# Patient Record
Sex: Male | Born: 1935 | Race: White | Hispanic: No | Marital: Single | Smoking: Former smoker
Health system: Southern US, Community
[De-identification: ages and names within clinical notes are randomized; demographics above are authoritative.]

## PROBLEM LIST (undated history)

## (undated) DIAGNOSIS — R000 Tachycardia, unspecified: Secondary | ICD-10-CM

## (undated) DIAGNOSIS — R296 Repeated falls: Secondary | ICD-10-CM

## (undated) DIAGNOSIS — E119 Type 2 diabetes mellitus without complications: Secondary | ICD-10-CM

## (undated) DIAGNOSIS — F1011 Alcohol abuse, in remission: Secondary | ICD-10-CM

## (undated) DIAGNOSIS — F0391 Unspecified dementia with behavioral disturbance: Secondary | ICD-10-CM

## (undated) DIAGNOSIS — K219 Gastro-esophageal reflux disease without esophagitis: Secondary | ICD-10-CM

## (undated) DIAGNOSIS — S065X9A Traumatic subdural hemorrhage with loss of consciousness of unspecified duration, initial encounter: Secondary | ICD-10-CM

## (undated) DIAGNOSIS — Z960 Presence of urogenital implants: Secondary | ICD-10-CM

## (undated) DIAGNOSIS — F03918 Unspecified dementia, unspecified severity, with other behavioral disturbance: Secondary | ICD-10-CM

## (undated) DIAGNOSIS — R569 Unspecified convulsions: Secondary | ICD-10-CM

## (undated) DIAGNOSIS — S065XAA Traumatic subdural hemorrhage with loss of consciousness status unknown, initial encounter: Secondary | ICD-10-CM

## (undated) DIAGNOSIS — Z978 Presence of other specified devices: Secondary | ICD-10-CM

## (undated) DIAGNOSIS — I10 Essential (primary) hypertension: Secondary | ICD-10-CM

---

## 2012-06-13 ENCOUNTER — Emergency Department (HOSPITAL_COMMUNITY)
Admission: EM | Admit: 2012-06-13 | Discharge: 2012-06-14 | Disposition: A | Discharge status: Home / Self Care | Payer: PRIVATE HEALTH INSURANCE | Attending: Emergency Medicine | Admitting: Emergency Medicine

## 2012-06-13 DIAGNOSIS — R339 Retention of urine, unspecified: Secondary | ICD-10-CM

## 2012-06-13 DIAGNOSIS — R338 Other retention of urine: Secondary | ICD-10-CM | POA: Insufficient documentation

## 2012-06-13 DIAGNOSIS — F0390 Unspecified dementia without behavioral disturbance: Secondary | ICD-10-CM | POA: Insufficient documentation

## 2012-06-13 DIAGNOSIS — S3730XA Unspecified injury of urethra, initial encounter: Secondary | ICD-10-CM | POA: Insufficient documentation

## 2012-06-13 DIAGNOSIS — S3720XA Unspecified injury of bladder, initial encounter: Secondary | ICD-10-CM | POA: Insufficient documentation

## 2012-06-13 DIAGNOSIS — Y846 Urinary catheterization as the cause of abnormal reaction of the patient, or of later complication, without mention of misadventure at the time of the procedure: Secondary | ICD-10-CM | POA: Insufficient documentation

## 2012-06-13 NOTE — ED Notes (Signed)
Bladder scanner read >200 ml

## 2012-06-13 NOTE — ED Notes (Signed)
[id number]<AV> Expected date:<BR> Expected time: 7:02 PM<BR> Means of arrival:<BR> Comments:<BR> Pulled foley out

## 2012-06-13 NOTE — ED Notes (Signed)
EMS called to [facility].  Staff stated that patient had removed his foley this AM with Balloon intact.  Patient has Hx of dementia

## 2012-06-13 NOTE — ED Notes (Addendum)
Pts had 2 episodes of diarrhea since arrival. RN is aware

## 2012-06-13 NOTE — ED Notes (Signed)
PER EMS- pt picked up from [facility] with c/o foley catheter pulled out,by pt.  Reports that pt has had foley catheter x3 mos.

## 2012-06-13 NOTE — ED Notes (Signed)
GU cart at bedside

## 2012-06-13 NOTE — ED Provider Notes (Signed)
History     CSN: [id number]  Arrival date & time 06/13/12  7829   First MD Initiated Contact with Patient 06/13/12 2005      Chief Complaint  Patient presents with  . Groin Injury    Foley Cath Pulled Out   level V caveat applies secondary to dementia   HPI  Patient is a 77 year old male with history of dementia who presents from [facility] nursing home after pulling out a Foley catheter. Per nursing home report patient pulled out Foley catheter with the balloon inflated. Patient has had an indwelling catheter for the past 3 months. Patient was noted to have bleeding from the penis after the incident. He was also uncomfortable and seemed to be complaining of pains. Patient has not had any mental status changes. He has not had any recent fevers or other symptoms.    No past medical history on file.  No past surgical history on file.  No family history on file.  History  Substance Use Topics  . Smoking status: Not on file  . Smokeless tobacco: Not on file  . Alcohol Use: Not on file      Review of Systems  Constitutional: Negative for fever.  All other systems reviewed and are negative.    Allergies  Review of patient's allergies indicates not on file.  Home Medications  No current outpatient prescriptions on file.  BP 182/87  Pulse 78  Temp 97.9 F (36.6 C) (Oral)  Resp 18  SpO2 94%  Physical Exam  Nursing note and vitals reviewed. Constitutional: He appears well-developed and well-nourished.  HENT:  Head: Normocephalic.  Cardiovascular: Normal rate and regular rhythm.   Pulmonary/Chest: Effort normal and breath sounds normal.  Genitourinary:       No active bleeding from the penis but there is dry blood at the tip of the penis around the scrotum. No swelling of the penis.  Neurological: He is alert.       Patient is demented and appears to be at baseline mental status. He is calm and cooperative.     ED Course  Procedures    Results for orders  placed during the hospital encounter of 06/13/12  URINALYSIS, ROUTINE W REFLEX MICROSCOPIC      Component Value Range   Color, Urine YELLOW  YELLOW   APPearance CLOUDY (*) CLEAR   Specific Gravity, Urine 1.016  1.005 - 1.030   pH 7.5  5.0 - 8.0   Glucose, UA NEGATIVE  NEGATIVE mg/dL   Hgb urine dipstick LARGE (*) NEGATIVE   Bilirubin Urine NEGATIVE  NEGATIVE   Ketones, ur NEGATIVE  NEGATIVE mg/dL   Protein, ur 30 (*) NEGATIVE mg/dL   Urobilinogen, UA 1.0  0.0 - 1.0 mg/dL   Nitrite NEGATIVE  NEGATIVE   Leukocytes, UA SMALL (*) NEGATIVE  URINE MICROSCOPIC-ADD ON      Component Value Range   Squamous Epithelial / LPF FEW (*) RARE   WBC, UA 3-6  <3 WBC/hpf   RBC / HPF TOO NUMEROUS TO COUNT  <3 RBC/hpf   Bacteria, UA FEW (*) RARE   Urine-Other FIELD OBSCURED BY RBC'S    CBC WITH DIFFERENTIAL      Component Value Range   WBC 16.0 (*) 4.0 - 10.5 K/uL   RBC 3.86 (*) 4.22 - 5.81 MIL/uL   Hemoglobin 11.4 (*) 13.0 - 17.0 g/dL   HCT 56.2 (*) 13.0 - 86.5 %   MCV 85.0  78.0 - 100.0 fL  MCH 29.5  26.0 - 34.0 pg   MCHC 34.8  30.0 - 36.0 g/dL   RDW 40.9  81.1 - 91.4 %   Platelets 213  150 - 400 K/uL   Neutrophils Relative 84 (*) 43 - 77 %   Neutro Abs 13.4 (*) 1.7 - 7.7 K/uL   Lymphocytes Relative 9 (*) 12 - 46 %   Lymphs Abs 1.5  0.7 - 4.0 K/uL   Monocytes Relative 6  3 - 12 %   Monocytes Absolute 0.9  0.1 - 1.0 K/uL   Eosinophils Relative 2  0 - 5 %   Eosinophils Absolute 0.2  0.0 - 0.7 K/uL   Basophils Relative 0  0 - 1 %   Basophils Absolute 0.0  0.0 - 0.1 K/uL  BASIC METABOLIC PANEL      Component Value Range   Sodium 135  135 - 145 mEq/L   Potassium 3.9  3.5 - 5.1 mEq/L   Chloride 97  96 - 112 mEq/L   CO2 27  19 - 32 mEq/L   Glucose, Bld 194 (*) 70 - 99 mg/dL   BUN 25 (*) 6 - 23 mg/dL   Creatinine, Ser 7.82  0.50 - 1.35 mg/dL   Calcium 9.3  8.4 - 95.6 mg/dL   GFR calc non Af Amer 80 (*) >90 mL/min   GFR calc Af Amer >90  >90 mL/min       1. Urethral injury   2.  Urinary retention       MDM  8:25 PM patient seen and evaluated. Patient does not appear in any acute distress. Patient is demented.  Nursing staff unable to pass Foley catheter. Pt discussed with Attending Physician.  Will consult urology  Spoke with Dr. Isabel Caprice on call for urology. He will come see patient. Would like to you can't the cystoscope at bedside.   11:40 PM Dr. Isabel Caprice at the bedside.   Dr. Isabel Caprice was able to pass a Foley catheter with almost 1000 L of output.      Phill Mutter Ali Molina, [place] 06/14/12 [phone]

## 2012-06-14 LAB — CBC WITH DIFFERENTIAL/PLATELET
Eosinophils Absolute: 0.2 10*3/uL (ref 0.0–0.7)
Eosinophils Relative: 2 % (ref 0–5)
Hemoglobin: 11.4 g/dL — ABNORMAL LOW (ref 13.0–17.0)
Lymphs Abs: 1.5 10*3/uL (ref 0.7–4.0)
MCH: 29.5 pg (ref 26.0–34.0)
MCV: 85 fL (ref 78.0–100.0)
Monocytes Absolute: 0.9 10*3/uL (ref 0.1–1.0)
Monocytes Relative: 6 % (ref 3–12)
RBC: 3.86 MIL/uL — ABNORMAL LOW (ref 4.22–5.81)

## 2012-06-14 LAB — BASIC METABOLIC PANEL
BUN: 25 mg/dL — ABNORMAL HIGH (ref 6–23)
Calcium: 9.3 mg/dL (ref 8.4–10.5)
Creatinine, Ser: 0.94 mg/dL (ref 0.50–1.35)
GFR calc non Af Amer: 80 mL/min — ABNORMAL LOW (ref >90)
Glucose, Bld: 194 mg/dL — ABNORMAL HIGH (ref 70–99)
Potassium: 3.9 mEq/L (ref 3.5–5.1)

## 2012-06-14 LAB — URINALYSIS, ROUTINE W REFLEX MICROSCOPIC
Bilirubin Urine: NEGATIVE
Glucose, UA: NEGATIVE mg/dL
Ketones, ur: NEGATIVE mg/dL
Nitrite: NEGATIVE
Protein, ur: 30 mg/dL — AB
pH: 7.5 (ref 5.0–8.0)

## 2012-06-14 MED ORDER — CEPHALEXIN 500 MG PO CAPS: 500.0000 mg | ORAL_CAPSULE | Freq: Four times a day (QID) | ORAL | Status: DC

## 2012-06-14 NOTE — ED Provider Notes (Signed)
Medical screening examination/treatment/procedure(s) were performed by non-physician practitioner and as supervising physician I was immediately available for consultation/collaboration.   Joden Bonsall, MD 06/14/12 2306 

## 2012-06-14 NOTE — Consult Note (Signed)
Urology Consult  Referring physician: Ivonne Andrew, PA-C   [facility] Reason for referral: Foley catheter trauma with inability to reinsert Foley catheter  History of Present Illness: 77 year old male with unclear prior urologic history who was brought in by a nursing facility this evening after pulling out his Foley catheter with the balloon inflated. Patient apparently suffers from significant dementia. We have no access to additional urologic records. Apparently he has had an indwelling Foley catheter for months now. Not clear whether he has done this in the past. The patient was noted to have blood per urethra. Multiple attempts at catheter insertion in the emergency room were unsuccessful and therefore we are asked to consult in to help assist in placing a new catheter.  No past medical history on file. No past surgical history on file.  Medications: Prior to Admission:  (Not in a hospital admission)  Allergies: No Known Allergies  No family history on file.  Social History:  does not have a smoking history on file. He does not have any smokeless tobacco history on file. His alcohol and drug histories not on file. Not obtainable   Physical Exam:  Vital signs in last 24 hours: Temp:  [97.9 F (36.6 C)] 97.9 F (36.6 C) (12/07 1921) Pulse Rate:  [78] 78  (12/07 1921) Resp:  [18] 18  (12/07 1921) BP: (182)/(87) 182/87 mmHg (12/07 1921) SpO2:  [94 %] 94 % (12/07 1921)  Constitutional: Vital signs reviewed. WD WN in NAD Head: Normocephalic and atraumatic    Neck: Supple No  Gross JVD, mass, thyromegaly, or carotid bruit present.  Cardiovascular: RRR Pulmonary/Chest: Normal effort Abdominal: Soft. Non-tender, non-distended, bowel sounds are normal, no masses, organomegaly, or guarding present.  Genitourinary: Circumcised penis with blood and meatus. Extremities: No cyanosis or edema  Neurological: Grossly non-focal.  Skin: Warm,very dry and intact. No rash,  cyanosis   Procedure:   Bedside flexible cystoscopy with difficult Foley catheter placement  Patient was prepped and draped in usual manner. A flexible cystoscopy one could see a significant false passage posteriorly within the bulbar urethra. I was able to guide the cystoscope into a distended bladder. Because of some hematuria and cloudy urine visualization of the bladder to rule out other pathology was not really possible. Through the cystoscope a guidewire was placed. Over the guidewire we placed a 16 [place] tip Foley catheter without difficulty. Approximate thousand cc of dark concentrated urine was obtained. Laboratory Data:  No results found for this or any previous visit (from the past 72 hour([name])). No results found for this or any previous visit (from the past 240 hour([name])). Creatinine: No results found for this basename: CREATININE:7 in the last 168 hours Baseline Creatinine:   Impression/Assessment:  Urinary retention with urethral trauma status post traumatic removal of Foley catheter with balloon inflated. Again I am uncertain about this patient'[name] urologic history. Recently he is at risk for recurrent episodes due to dementia and confusion. The patient may ultimately benefit from suprapubic tube placement. Urethral injury should heal with continued drainage utilizing a Foley catheter as well as the patient does not dramatic remove it again. Generally we do not like to treat bacteriuria in patients with chronic indwelling catheters been given the trauma he probably should be treated with 5-7 days of appropriate antibiotic with urine culture obtained prior to discharge. He should have some urologic followup probably around the time that he would be due for X. Foley change in 3-4 weeks.  Plan:  As above  Ricardo [name] 06/14/2012, 12:07 AM

## 2012-06-16 LAB — URINE CULTURE: Colony Count: 85000

## 2012-06-17 NOTE — ED Notes (Signed)
+   Urine Patient treated with Keflex-sensitive to same-chart appended per protocol MD. 

## 2012-10-17 ENCOUNTER — Emergency Department (HOSPITAL_COMMUNITY): Payer: Medicare Other

## 2012-10-17 ENCOUNTER — Emergency Department (HOSPITAL_COMMUNITY)
Admission: EM | Admit: 2012-10-17 | Discharge: 2012-10-18 | Disposition: A | Discharge status: Home / Self Care | Payer: Medicare Other | Attending: Emergency Medicine | Admitting: Emergency Medicine

## 2012-10-17 ENCOUNTER — Encounter (HOSPITAL_COMMUNITY): Payer: Self-pay | Admitting: Emergency Medicine

## 2012-10-17 DIAGNOSIS — F03918 Unspecified dementia, unspecified severity, with other behavioral disturbance: Secondary | ICD-10-CM | POA: Insufficient documentation

## 2012-10-17 DIAGNOSIS — Z8679 Personal history of other diseases of the circulatory system: Secondary | ICD-10-CM | POA: Insufficient documentation

## 2012-10-17 DIAGNOSIS — Z4682 Encounter for fitting and adjustment of non-vascular catheter: Secondary | ICD-10-CM | POA: Insufficient documentation

## 2012-10-17 DIAGNOSIS — G40909 Epilepsy, unspecified, not intractable, without status epilepticus: Secondary | ICD-10-CM | POA: Insufficient documentation

## 2012-10-17 DIAGNOSIS — Z8719 Personal history of other diseases of the digestive system: Secondary | ICD-10-CM | POA: Insufficient documentation

## 2012-10-17 DIAGNOSIS — Z794 Long term (current) use of insulin: Secondary | ICD-10-CM | POA: Insufficient documentation

## 2012-10-17 DIAGNOSIS — F0391 Unspecified dementia with behavioral disturbance: Secondary | ICD-10-CM

## 2012-10-17 DIAGNOSIS — E119 Type 2 diabetes mellitus without complications: Secondary | ICD-10-CM | POA: Insufficient documentation

## 2012-10-17 DIAGNOSIS — I10 Essential (primary) hypertension: Secondary | ICD-10-CM | POA: Insufficient documentation

## 2012-10-17 DIAGNOSIS — Z9181 History of falling: Secondary | ICD-10-CM | POA: Insufficient documentation

## 2012-10-17 DIAGNOSIS — Z79899 Other long term (current) drug therapy: Secondary | ICD-10-CM | POA: Insufficient documentation

## 2012-10-17 HISTORY — DX: Gastro-esophageal reflux disease without esophagitis: K21.9

## 2012-10-17 HISTORY — DX: Unspecified dementia, unspecified severity, with other behavioral disturbance: F03.918

## 2012-10-17 HISTORY — DX: Type 2 diabetes mellitus without complications: E11.9

## 2012-10-17 HISTORY — DX: Alcohol abuse, in remission: F10.11

## 2012-10-17 HISTORY — DX: Presence of other specified devices: Z97.8

## 2012-10-17 HISTORY — DX: Traumatic subdural hemorrhage with loss of consciousness of unspecified duration, initial encounter: S06.5X9A

## 2012-10-17 HISTORY — DX: Unspecified dementia with behavioral disturbance: F03.91

## 2012-10-17 HISTORY — DX: Unspecified convulsions: R56.9

## 2012-10-17 HISTORY — DX: Traumatic subdural hemorrhage with loss of consciousness status unknown, initial encounter: S06.5XAA

## 2012-10-17 HISTORY — DX: Essential (primary) hypertension: I10

## 2012-10-17 HISTORY — DX: Presence of urogenital implants: Z96.0

## 2012-10-17 HISTORY — DX: Tachycardia, unspecified: R00.0

## 2012-10-17 HISTORY — DX: Repeated falls: R29.6

## 2012-10-17 LAB — BASIC METABOLIC PANEL
BUN: 24 mg/dL — ABNORMAL HIGH (ref 6–23)
Creatinine, Ser: 0.93 mg/dL (ref 0.50–1.35)
GFR calc Af Amer: >90 mL/min (ref >90)
GFR calc non Af Amer: 80 mL/min — ABNORMAL LOW (ref >90)
Potassium: 3.7 mEq/L (ref 3.5–5.1)

## 2012-10-17 LAB — URINALYSIS, ROUTINE W REFLEX MICROSCOPIC
Bilirubin Urine: NEGATIVE
Ketones, ur: NEGATIVE mg/dL
Nitrite: POSITIVE — AB
Protein, ur: 30 mg/dL — AB
Specific Gravity, Urine: 1.025 (ref 1.005–1.030)
Urobilinogen, UA: 1 mg/dL (ref 0.0–1.0)

## 2012-10-17 LAB — CBC WITH DIFFERENTIAL/PLATELET
Basophils Relative: 0 % (ref 0–1)
Eosinophils Absolute: 0.3 10*3/uL (ref 0.0–0.7)
Hemoglobin: 10.5 g/dL — ABNORMAL LOW (ref 13.0–17.0)
MCH: 29.7 pg (ref 26.0–34.0)
MCHC: 34 g/dL (ref 30.0–36.0)
Monocytes Relative: 8 % (ref 3–12)
Neutrophils Relative %: 64 % (ref 43–77)

## 2012-10-17 LAB — URINE MICROSCOPIC-ADD ON
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
## 2012-10-18 ENCOUNTER — Emergency Department (HOSPITAL_COMMUNITY): Payer: Medicare Other

## 2012-10-18 LAB — GLUCOSE, CAPILLARY: Glucose-Capillary: 97 mg/dL (ref 70–99)

## 2012-10-18 MED ORDER — CARVEDILOL 12.5 MG PO TABS
12.5000 mg | ORAL_TABLET | Freq: Two times a day (BID) | ORAL | Status: DC
  Filled 2012-10-18 (×3): qty 1

## 2012-10-18 MED ORDER — CLONAZEPAM 0.5 MG PO TABS: 0.5000 mg | ORAL_TABLET | Freq: Two times a day (BID) | ORAL | Status: DC | PRN

## 2012-10-18 MED ORDER — NITROFURANTOIN MONOHYD MACRO 100 MG PO CAPS
100.0000 mg | ORAL_CAPSULE | Freq: Once | ORAL | Status: AC
  Administered 2012-10-18: 100 mg via ORAL
  Filled 2012-10-18: qty 1

## 2012-10-18 MED ORDER — FAMOTIDINE 20 MG PO TABS
20.0000 mg | ORAL_TABLET | Freq: Once | ORAL | Status: AC
  Administered 2012-10-18: 20 mg via ORAL
  Filled 2012-10-18: qty 1

## 2012-10-18 MED ORDER — VALPROIC ACID 250 MG PO CAPS
250.0000 mg | ORAL_CAPSULE | Freq: Once | ORAL | Status: AC
  Administered 2012-10-18: 250 mg via ORAL
  Filled 2012-10-18: qty 1

## 2012-10-18 NOTE — ED Notes (Signed)
[facility] notified for transportation back to [facility].

## 2012-10-20 LAB — URINE CULTURE

## 2012-10-21 ENCOUNTER — Telehealth (HOSPITAL_COMMUNITY): Payer: Self-pay | Admitting: Emergency Medicine

## 2012-10-23 ENCOUNTER — Telehealth (HOSPITAL_COMMUNITY): Payer: Self-pay | Admitting: Emergency Medicine

## 2012-10-23 NOTE — ED Notes (Signed)
No tx indicated per Arthor Captain PA

## 2012-11-09 ENCOUNTER — Non-Acute Institutional Stay (SKILLED_NURSING_FACILITY): Payer: Medicare Other | Admitting: Internal Medicine

## 2012-11-09 ENCOUNTER — Encounter: Payer: Self-pay | Admitting: Internal Medicine

## 2012-11-09 DIAGNOSIS — R5381 Other malaise: Secondary | ICD-10-CM

## 2012-11-09 DIAGNOSIS — E119 Type 2 diabetes mellitus without complications: Secondary | ICD-10-CM

## 2012-11-09 DIAGNOSIS — R1310 Dysphagia, unspecified: Secondary | ICD-10-CM

## 2012-11-09 DIAGNOSIS — R339 Retention of urine, unspecified: Secondary | ICD-10-CM

## 2012-11-09 DIAGNOSIS — F919 Conduct disorder, unspecified: Secondary | ICD-10-CM

## 2012-11-09 DIAGNOSIS — R4189 Other symptoms and signs involving cognitive functions and awareness: Secondary | ICD-10-CM

## 2012-11-09 DIAGNOSIS — R531 Weakness: Secondary | ICD-10-CM | POA: Insufficient documentation

## 2012-11-09 DIAGNOSIS — R4689 Other symptoms and signs involving appearance and behavior: Secondary | ICD-10-CM | POA: Insufficient documentation

## 2012-11-09 DIAGNOSIS — E039 Hypothyroidism, unspecified: Secondary | ICD-10-CM

## 2012-11-09 DIAGNOSIS — F0630 Mood disorder due to known physiological condition, unspecified: Secondary | ICD-10-CM | POA: Insufficient documentation

## 2012-11-09 DIAGNOSIS — K219 Gastro-esophageal reflux disease without esophagitis: Secondary | ICD-10-CM

## 2012-11-09 NOTE — Progress Notes (Signed)
Patient ID: Ricardo [name], male   DOB: 02-23-1935, 78 y.o.   MRN: [id number]    PCP: Oneal Grout, MD  Code Status: full code  No Known Allergies  Chief Complaint: medical management of chronic illness  HPI:  79 y/o male patient is here for LTC. He is s/p subdural hematoma from suspected seizure. He has been seizure free in the facility. He has behavioral problem and is followed by psych. He has peg tube in place and has not required any bolus feed recently. He is tolerating po feed well. He also has chronic indwelling foley catheter and recurrent uti. He failed voiding trial few month back. He has not seen urology. He is alert and oriented to person but restless and has minimal participation in conversation. He has baseline cognitive impairment. Reviewed psych notes from 09/21/12. No recent MMSE  Review of Systems: Review of Systems  Constitutional: Negative for fever, chills, weight loss and diaphoresis.  HENT: Negative for congestion and sore throat.   Eyes: Negative for blurred vision.  Respiratory: Negative for cough and shortness of breath.   Cardiovascular: Negative for chest pain, palpitations and leg swelling.  Gastrointestinal: Negative for heartburn, nausea, vomiting and abdominal pain.  Musculoskeletal: Negative for falls.       Fall risk  Skin: Negative for rash.  Neurological: Negative for dizziness and headaches.  Psychiatric/Behavioral: Positive for memory loss. Negative for depression. The patient is nervous/anxious.     Past Medical History  Diagnosis Date  . Subdural hematoma, post-traumatic   . Chronic indwelling foley catheter   . Dementia with aggressive behavior   . Frequent falls   . H/O alcohol abuse   . DM (diabetes mellitus)   . HTN (hypertension)   . Sinus tachycardia   . Seizures   . GERD (gastroesophageal reflux disease)    No past surgical history on file. Social History:   reports that he has quit smoking. He does not have any smokeless  tobacco history on file. His alcohol and drug histories are not on file.  No family history on file.  Medications: Patient's Medications  New Prescriptions   No medications on file  Previous Medications   ACETAMINOPHEN (TYLENOL) 325 MG TABLET    650 mg by PEG Tube route every 8 (eight) hours as needed. For fever/pain   ALBUTEROL (PROVENTIL) (2.5 MG/3ML) 0.083% NEBULIZER SOLUTION    Take 2.5 mg by nebulization every 4 (four) hours as needed. For shortness of breath   CARVEDILOL (COREG) 12.5 MG TABLET    12.5 mg by PEG Tube route 2 (two) times daily with a meal.   CLONAZEPAM (KLONOPIN) 0.5 MG TABLET    Take 1 tablet (0.5 mg total) by mouth 2 (two) times daily as needed for anxiety.   DIVALPROEX (DEPAKOTE SPRINKLE) 125 MG CAPSULE    Take 250 mg by mouth. Taking 250 mg bid and 125 mg at 2 pm   DOCUSATE SODIUM (COLACE) 100 MG CAPSULE    Take 100 mg by mouth 2 (two) times daily.    FAMOTIDINE (PEPCID) 20 MG TABLET    Take 20 mg by mouth 2 (two) times daily.    INSULIN DETEMIR (LEVEMIR) 100 UNIT/ML INJECTION    Inject 10 Units into the skin at bedtime.   INSULIN LISPRO (HUMALOG) 100 UNIT/ML INJECTION    Inject 3 Units into the skin 3 (three) times daily before meals. Per Sliding scale   LEVETIRACETAM (KEPPRA) 500 MG TABLET    500 mg by PEG Tube  route every 12 (twelve) hours.   LEVOTHYROXINE (SYNTHROID, LEVOTHROID) 50 MCG TABLET    Take 50 mcg by mouth daily before breakfast.   QUETIAPINE (SEROQUEL) 50 MG TABLET    Take 50 mg by mouth 2 (two) times daily.   SERTRALINE (ZOLOFT) 25 MG TABLET    Take 75 mg by mouth daily.    THIAMINE (VITAMIN B-1) 100 MG TABLET    Take 100 mg by mouth 2 (two) times daily.   TRAZODONE (DESYREL) 100 MG TABLET    Take 100 mg by mouth daily. At 4pm each day  Modified Medications   No medications on file  Discontinued Medications   DEXTROSE (GLUTOSE) 40 % GEL    Take by mouth once as needed. For cbg < 60    Physical Exam:  Filed Vitals:   11/09/12 1817  BP: 145/76   Pulse: 63  Temp: 97.4 F (36.3 C)  Resp: 18  SpO2: 95%   gen- frail elderly male in no acute distress heent- no pallor, no octerus, perrla, mmm cvs- n s1, s2, rrr, no m/r/g respi- b/l cta, no wheeze/ rhonchi Gi- peg tube in place, site clean, bowel sounds present, no guarding/ rigidity gu- foley in place, cloudy urine, strong odor, no suprapubic tenderness Musculoskeletal- moves all 4 extremiites, working with therapy team Neurological- aaox 2, calm, directable today  Labs reviewed: Basic Metabolic Panel:  Recent Labs  16/10/96 0129 10/17/12 2314  NA 135 140  K 3.9 3.7  CL 97 103  CO2 27 29  GLUCOSE 194* 97  BUN 25* 24*  CREATININE 0.94 0.93  CALCIUM 9.3 9.0   CBC:  Recent Labs  06/14/12 0129 10/17/12 2314  WBC 16.0* 9.7  NEUTROABS 13.4* 6.3  HGB 11.4* 10.5*  HCT 32.8* 30.9*  MCV 85.0 87.3  PLT 213 160   CBG:  Recent Labs  10/18/12 0644  GLUCAP 97   10/15/12- alp 123, rest of lft wnl, depakote level 19.7 10/02/12 urine culture- e.coli  Assessment/Plan  Dysphagia- improved, tolerating po feed well. Not using peg tube. Will provide gi referral for tube removal given his good po intake  Urinary retention-will provide another voiding trial and see how pt tolerates this. Will need to send u/a and urine c/s. Will also have him on flomax to help assist with urine outflow ans provide urology referral  Weakness- continue with PT/OT. Fall precuations  Cognitive impairment- rule out worsening by checking MMSE.   Mood disorder- calm at present with current regimen of depakote, clonazepam. Also continue seroquel and zoloft. Appreciate psych input  Seizure- remains seizure free. Continue keppra  HTN- bp under control. Continue coreg  gerd- symptoms under control with famotidine, no changes made  DM- monitor cbg, continue levemir and humalog, monitor for hypoglycemia. Check a1c  Hypothyroidism- continue levothyroxine, check tsh. Last tsh 12/13 was  10.799   Family/ staff Communication: reviewed plan of care with nursing supervisor   Goals of care: rehab, labs, have chronic problems under control   Labs/tests ordered: a1c, tsh, ua with c/s, voiding trial, urology and gi referral

## 2012-11-23 ENCOUNTER — Encounter: Payer: Self-pay | Admitting: Nurse Practitioner

## 2012-12-12 ENCOUNTER — Encounter (HOSPITAL_COMMUNITY): Payer: Self-pay | Admitting: Emergency Medicine

## 2012-12-12 ENCOUNTER — Emergency Department (HOSPITAL_COMMUNITY)
Admission: EM | Admit: 2012-12-12 | Discharge: 2012-12-13 | Disposition: A | Discharge status: Skilled Nursing Facility | Payer: Medicare Other | Attending: Emergency Medicine | Admitting: Emergency Medicine

## 2012-12-12 DIAGNOSIS — G40802 Other epilepsy, not intractable, without status epilepticus: Secondary | ICD-10-CM | POA: Insufficient documentation

## 2012-12-12 DIAGNOSIS — I10 Essential (primary) hypertension: Secondary | ICD-10-CM | POA: Insufficient documentation

## 2012-12-12 DIAGNOSIS — F0391 Unspecified dementia with behavioral disturbance: Secondary | ICD-10-CM | POA: Insufficient documentation

## 2012-12-12 DIAGNOSIS — K219 Gastro-esophageal reflux disease without esophagitis: Secondary | ICD-10-CM | POA: Insufficient documentation

## 2012-12-12 DIAGNOSIS — E119 Type 2 diabetes mellitus without complications: Secondary | ICD-10-CM | POA: Insufficient documentation

## 2012-12-12 DIAGNOSIS — Z87891 Personal history of nicotine dependence: Secondary | ICD-10-CM | POA: Insufficient documentation

## 2012-12-12 DIAGNOSIS — N390 Urinary tract infection, site not specified: Secondary | ICD-10-CM

## 2012-12-12 DIAGNOSIS — F03918 Unspecified dementia, unspecified severity, with other behavioral disturbance: Secondary | ICD-10-CM | POA: Insufficient documentation

## 2012-12-12 NOTE — ED Notes (Addendum)
Per EMS, Pt from [facility], Staff report pt was having lower abdominal pain. However when EMS arrived and palpated pt's abdomen pt did not respond. When EMS moved pt, pt grabbed his groin. Staff denies pt having problems urinating, frequency, odor, or blood in urine. Pt is limited with communication, pt has a hx of dementia. VSS.

## 2012-12-12 NOTE — ED Notes (Signed)
Pt would not answer RN's questions when RN was assessing pt. Pt has hx of dementia and has minimal verbal communication at baseline per facility.

## 2012-12-13 ENCOUNTER — Emergency Department (HOSPITAL_COMMUNITY): Payer: Medicare Other

## 2012-12-13 LAB — CBC WITH DIFFERENTIAL/PLATELET
Eosinophils Absolute: 0.3 10*3/uL (ref 0.0–0.7)
Eosinophils Relative: 4 % (ref 0–5)
HCT: 28.7 % — ABNORMAL LOW (ref 39.0–52.0)
Lymphocytes Relative: 27 % (ref 12–46)
Lymphs Abs: 2.2 10*3/uL (ref 0.7–4.0)
MCH: 30.8 pg (ref 26.0–34.0)
MCV: 87.5 fL (ref 78.0–100.0)
Monocytes Absolute: 0.8 10*3/uL (ref 0.1–1.0)
Platelets: 149 10*3/uL — ABNORMAL LOW (ref 150–400)
RBC: 3.28 MIL/uL — ABNORMAL LOW (ref 4.22–5.81)

## 2012-12-13 LAB — COMPREHENSIVE METABOLIC PANEL
ALT: 10 U/L (ref 0–53)
BUN: 30 mg/dL — ABNORMAL HIGH (ref 6–23)
CO2: 29 mEq/L (ref 19–32)
Calcium: 9 mg/dL (ref 8.4–10.5)
Creatinine, Ser: 0.98 mg/dL (ref 0.50–1.35)
GFR calc Af Amer: >90 mL/min (ref >90)
GFR calc non Af Amer: 78 mL/min — ABNORMAL LOW (ref >90)
Glucose, Bld: 88 mg/dL (ref 70–99)
Sodium: 138 mEq/L (ref 135–145)
Total Protein: 6.3 g/dL (ref 6.0–8.3)

## 2012-12-13 LAB — URINALYSIS, ROUTINE W REFLEX MICROSCOPIC
Ketones, ur: NEGATIVE mg/dL
Nitrite: NEGATIVE
Specific Gravity, Urine: 1.019 (ref 1.005–1.030)
Urobilinogen, UA: 2 mg/dL — ABNORMAL HIGH (ref 0.0–1.0)
pH: 7 (ref 5.0–8.0)

## 2012-12-13 LAB — URINE MICROSCOPIC-ADD ON

## 2012-12-13 MED ORDER — CEFTRIAXONE SODIUM 1 G IJ SOLR
1.0000 g | Freq: Once | INTRAMUSCULAR | Status: AC
  Administered 2012-12-13: 1 g via INTRAMUSCULAR
  Filled 2012-12-13: qty 10

## 2012-12-13 MED ORDER — CEPHALEXIN 500 MG PO CAPS: 500.0000 mg | ORAL_CAPSULE | Freq: Four times a day (QID) | ORAL | Status: DC

## 2012-12-13 MED ORDER — LIDOCAINE HCL (PF) 1 % IJ SOLN
INTRAMUSCULAR | Status: AC
  Administered 2012-12-13: 2.1 mL
  Filled 2012-12-13: qty 5

## 2012-12-13 MED ORDER — DEXTROSE 5 % IV SOLN: 1.0000 g | Freq: Once | INTRAVENOUS | Status: DC

## 2012-12-13 NOTE — ED Provider Notes (Signed)
History     CSN: [id number]  Arrival date & time 12/12/12  2325   First MD Initiated Contact with Patient 12/12/12 2359      Chief Complaint  Patient presents with  . Groin Pain    (Consider location/radiation/quality/duration/timing/severity/associated sxs/prior treatment) HPI Comments: Patient with history of dementia sent from [facility] for evaluation of groin pain.  He was apparently complaining of lower abdominal / groin pain to the nursing staff.  He is unable to add any useful history due to dementia.  A level 5 caveat applies.  The history is provided by the patient, the EMS personnel and the nursing home.    Past Medical History  Diagnosis Date  . Subdural hematoma, post-traumatic   . Chronic indwelling Foley catheter   . Dementia with aggressive behavior   . Frequent falls   . H/O alcohol abuse   . DM (diabetes mellitus)   . HTN (hypertension)   . Sinus tachycardia   . Seizures   . GERD (gastroesophageal reflux disease)     History reviewed. No pertinent past surgical history.  No family history on file.  History  Substance Use Topics  . Smoking status: Former Games developer  . Smokeless tobacco: Not on file  . Alcohol Use: Not on file      Review of Systems  Unable to perform ROS   Allergies  Review of patient's allergies indicates no known allergies.  Home Medications   Current Outpatient Rx  Name  Route  Sig  Dispense  Refill  . acetaminophen (TYLENOL) 325 MG tablet   PEG Tube   650 mg by PEG Tube route every 8 (eight) hours as needed. For fever/pain         . carvedilol (COREG) 12.5 MG tablet   PEG Tube   12.5 mg by PEG Tube route 2 (two) times daily with a meal.         . clonazePAM (KLONOPIN) 0.5 MG tablet   Oral   Take 1 tablet (0.5 mg total) by mouth 2 (two) times daily as needed for anxiety.   30 tablet   0   . divalproex (DEPAKOTE SPRINKLE) 125 MG capsule   Oral   Take 250 mg by mouth 2 (two) times daily.          . divalproex  (DEPAKOTE SPRINKLE) 125 MG capsule   Oral   Take 125 mg by mouth daily at 2 PM daily at 2 PM.         . docusate (COLACE) 50 MG/5ML liquid   Oral   Take 100 mg by mouth 2 (two) times daily.         . famotidine (PEPCID) 20 MG tablet   Oral   Take 20 mg by mouth 2 (two) times daily.          . insulin detemir (LEVEMIR) 100 UNIT/ML injection   Subcutaneous   Inject 10 Units into the skin at bedtime.         . insulin lispro (HUMALOG) 100 UNIT/ML injection   Subcutaneous   Inject 3 Units into the skin every 6 (six) hours as needed for high blood sugar. Per Sliding scale         . levETIRAcetam (KEPPRA) 500 MG tablet   Oral   Take 500 mg by mouth 2 (two) times daily.          Marland Kitchen levothyroxine (SYNTHROID, LEVOTHROID) 75 MCG tablet   Oral   Take 75 mcg by mouth  daily before breakfast.         . QUEtiapine (SEROQUEL) 50 MG tablet   Oral   Take 50 mg by mouth 2 (two) times daily.         . sertraline (ZOLOFT) 25 MG tablet   Oral   Take 75 mg by mouth daily.          . tamsulosin (FLOMAX) 0.4 MG CAPS   Oral   Take 0.4 mg by mouth daily.         Marland Kitchen thiamine (VITAMIN B-1) 100 MG tablet   Oral   Take 100 mg by mouth 2 (two) times daily.         . traZODone (DESYREL) 100 MG tablet   Oral   Take 100 mg by mouth daily. At 4pm each day         . albuterol (PROVENTIL) (2.5 MG/3ML) 0.083% nebulizer solution   Nebulization   Take 2.5 mg by nebulization every 4 (four) hours as needed. For shortness of breath           BP 110/47  Pulse 68  Temp(Src) 98.5 F (36.9 C) (Oral)  Resp 16  SpO2 100%  Physical Exam  Nursing note and vitals reviewed. Constitutional:  Elderly male, no distress.  Appears comfortable.  HENT:  Head: Normocephalic and atraumatic.  Mouth/Throat: Oropharynx is clear and moist.  Neck: Normal range of motion. Neck supple.  Cardiovascular: Normal rate and regular rhythm.   No murmur heard. Pulmonary/Chest: Effort normal and  breath sounds normal. No respiratory distress. He has no wheezes.  Abdominal: Soft. Bowel sounds are normal. He exhibits no distension. There is no tenderness.  Musculoskeletal: Normal range of motion.  Both hips with good range of motion with no apparent discomfort.  Neurological: He is alert.  He is basically non-verbal to me, not answering any questions.  He does move all four extremities  Skin: Skin is warm and dry.    ED Course  Procedures (including critical care time)  Labs Reviewed - No data to display No results found.   No diagnosis found.    MDM  The workup shows no elevation of wbc, obstruction, but does show a uti.  This seems to fit the clinical presentation.  Will treat with rocephin, keflex.  Return prn.        Geoffery Lyons, MD 12/13/12 [phone]

## 2012-12-13 NOTE — ED Notes (Signed)
Patient transported to X-ray 

## 2012-12-13 NOTE — ED Notes (Signed)
Pt returned from XR. Placed back on monitor.

## 2012-12-13 NOTE — ED Notes (Signed)
Pt moaning in pain. Pt able to tell RN where pain is now. Pt points to pubic area when asked where pain is.

## 2012-12-13 NOTE — ED Notes (Signed)
MD at bedside. 

## 2012-12-13 NOTE — ED Notes (Signed)
PTAR paged. 

## 2012-12-15 LAB — URINE CULTURE

## 2012-12-16 NOTE — ED Notes (Signed)
Post ED Visit - Positive Culture Follow-up  Culture report reviewed by antimicrobial stewardship pharmacist: [x]  Ricardo [name], Pharm.D., BCPS []  Celedonio Miyamoto, [address].D., BCPS []  Georgina Pillion, Pharm.D., BCPS []  [place], [address].D., BCPS, AAHIVP []  Estella Husk, Pharm.D., BCPS, AAHIVP  Positive urine culture  no further patient follow-up is required at this time.  Ricardo [name] 12/16/2012, 4:16 PM

## 2012-12-23 ENCOUNTER — Encounter: Payer: Self-pay | Admitting: Internal Medicine

## 2013-01-14 ENCOUNTER — Encounter (INDEPENDENT_AMBULATORY_CARE_PROVIDER_SITE_OTHER): Payer: Medicare Other | Admitting: Ophthalmology

## 2013-01-14 DIAGNOSIS — H43819 Vitreous degeneration, unspecified eye: Secondary | ICD-10-CM

## 2013-01-14 DIAGNOSIS — H3530 Unspecified macular degeneration: Secondary | ICD-10-CM

## 2013-02-03 ENCOUNTER — Non-Acute Institutional Stay (SKILLED_NURSING_FACILITY): Payer: Medicare Other | Admitting: Adult Health

## 2013-02-03 DIAGNOSIS — E119 Type 2 diabetes mellitus without complications: Secondary | ICD-10-CM

## 2013-02-03 DIAGNOSIS — K5900 Constipation, unspecified: Secondary | ICD-10-CM

## 2013-02-03 DIAGNOSIS — R339 Retention of urine, unspecified: Secondary | ICD-10-CM

## 2013-02-03 DIAGNOSIS — N400 Enlarged prostate without lower urinary tract symptoms: Secondary | ICD-10-CM

## 2013-02-03 DIAGNOSIS — F323 Major depressive disorder, single episode, severe with psychotic features: Secondary | ICD-10-CM

## 2013-02-03 DIAGNOSIS — I10 Essential (primary) hypertension: Secondary | ICD-10-CM

## 2013-02-03 DIAGNOSIS — R1310 Dysphagia, unspecified: Secondary | ICD-10-CM

## 2013-02-03 DIAGNOSIS — K219 Gastro-esophageal reflux disease without esophagitis: Secondary | ICD-10-CM

## 2013-02-03 DIAGNOSIS — E039 Hypothyroidism, unspecified: Secondary | ICD-10-CM

## 2013-02-03 DIAGNOSIS — R569 Unspecified convulsions: Secondary | ICD-10-CM

## 2013-02-03 DIAGNOSIS — G4700 Insomnia, unspecified: Secondary | ICD-10-CM

## 2013-02-03 DIAGNOSIS — F32A Depression, unspecified: Secondary | ICD-10-CM

## 2013-02-08 ENCOUNTER — Encounter: Payer: Self-pay | Admitting: Adult Health

## 2013-02-08 DIAGNOSIS — K5900 Constipation, unspecified: Secondary | ICD-10-CM | POA: Insufficient documentation

## 2013-02-08 DIAGNOSIS — N400 Enlarged prostate without lower urinary tract symptoms: Secondary | ICD-10-CM | POA: Insufficient documentation

## 2013-02-08 DIAGNOSIS — I10 Essential (primary) hypertension: Secondary | ICD-10-CM | POA: Insufficient documentation

## 2013-02-08 DIAGNOSIS — R569 Unspecified convulsions: Secondary | ICD-10-CM | POA: Insufficient documentation

## 2013-02-08 DIAGNOSIS — F323 Major depressive disorder, single episode, severe with psychotic features: Secondary | ICD-10-CM | POA: Insufficient documentation

## 2013-02-08 DIAGNOSIS — G4700 Insomnia, unspecified: Secondary | ICD-10-CM | POA: Insufficient documentation

## 2013-02-08 NOTE — Assessment & Plan Note (Signed)
Will continue coreg 12.5 mg twice daily and will continue to monitor

## 2013-02-08 NOTE — Assessment & Plan Note (Signed)
Will continue flomax 0.4 mg daily  

## 2013-02-08 NOTE — Assessment & Plan Note (Signed)
Is voiding without difficulty; will continue flomax 0.4 mg daily

## 2013-02-08 NOTE — Assessment & Plan Note (Addendum)
No signs of aspiration present. Is not requiring the use of the peg tube for nutritional support. Does require nectar thick liquids; will continue current plan and will monitor his status

## 2013-02-08 NOTE — Progress Notes (Signed)
Patient ID: Ricardo [name], male   DOB: 06-Mar-1935, 78 y.o.   MRN: [id number]  [facility]  .No Known Allergies   Chief Complaint  Patient presents with  . Medical Managment of Chronic Issues    HPI: He is being seen for the management of his chronic illnesses. There are no concerns being voiced by the nursing staff at this time. He is not using his peg and has not used it apparently for a prolonged period of time; we need to think about removing the tube. He had an urine culture demonstrate yeast in the beginning of this month but I cannot find where this has been treated. Will need to recollect the specimen  Past Medical History  Diagnosis Date  . Subdural hematoma, post-traumatic   . Chronic indwelling Foley catheter   . Dementia with aggressive behavior   . Frequent falls   . H/O alcohol abuse   . DM (diabetes mellitus)   . HTN (hypertension)   . Sinus tachycardia   . Seizures   . GERD (gastroesophageal reflux disease)     No past surgical history on file.  VITAL SIGNS BP 117/63  Pulse 60  Ht 5\' 7"  (1.702 m)  Wt 131 lb 3.2 oz (59.512 kg)  BMI 20.54 kg/m2   Patient's Medications  New Prescriptions   No medications on file  Previous Medications   ACETAMINOPHEN (TYLENOL) 325 MG TABLET    650 mg by PEG Tube route every 8 (eight) hours as needed. For fever/pain   ALBUTEROL (PROVENTIL) (2.5 MG/3ML) 0.083% NEBULIZER SOLUTION    Take 2.5 mg by nebulization every 4 (four) hours as needed. For shortness of breath   CARVEDILOL (COREG) 12.5 MG TABLET    12.5 mg by PEG Tube route 2 (two) times daily with a meal.   CEPHALEXIN (KEFLEX) 500 MG CAPSULE    Take 1 capsule (500 mg total) by mouth 4 (four) times daily.   CEPHALEXIN (KEFLEX) 500 MG CAPSULE    Take 1 capsule (500 mg total) by mouth 4 (four) times daily.   CLONAZEPAM (KLONOPIN) 0.5 MG TABLET    Take 1 tablet (0.5 mg total) by mouth 2 (two) times daily as needed for anxiety.   DIVALPROEX (DEPAKOTE SPRINKLE) 125 MG CAPSULE     Take 250 mg by mouth 2 (two) times daily.    DIVALPROEX (DEPAKOTE SPRINKLE) 125 MG CAPSULE    Take 125 mg by mouth daily at 2 PM daily at 2 PM.   DOCUSATE (COLACE) 50 MG/5ML LIQUID    Take 100 mg by mouth 2 (two) times daily.   FAMOTIDINE (PEPCID) 20 MG TABLET    Take 20 mg by mouth 2 (two) times daily.    FOOD THICKENER (THICK IT) POWD    Take 1 Container by mouth as needed. For nectar thick liquids   INSULIN DETEMIR (LEVEMIR) 100 UNIT/ML INJECTION    Inject 10 Units into the skin at bedtime.   INSULIN LISPRO (HUMALOG) 100 UNIT/ML INJECTION    Inject 3 Units into the skin every 6 (six) hours as needed for high blood sugar. Per Sliding scale   LEVETIRACETAM (KEPPRA) 500 MG TABLET    Take 500 mg by mouth 2 (two) times daily.    LEVOTHYROXINE (SYNTHROID, LEVOTHROID) 75 MCG TABLET    Take 75 mcg by mouth daily before breakfast.   QUETIAPINE (SEROQUEL) 50 MG TABLET    Take 50 mg by mouth 2 (two) times daily.   SERTRALINE (ZOLOFT) 25 MG TABLET  Take 75 mg by mouth daily.    TAMSULOSIN (FLOMAX) 0.4 MG CAPS    Take 0.4 mg by mouth daily.   THIAMINE (VITAMIN B-1) 100 MG TABLET    Take 100 mg by mouth 2 (two) times daily.   TRAZODONE (DESYREL) 100 MG TABLET    Take 100 mg by mouth daily. At 4pm each day  Modified Medications   No medications on file  Discontinued Medications   No medications on file    SIGNIFICANT DIAGNOSTIC EXAMS    LABS REVIEWED  11-17-12: wbc 8.4; hgb 11.1; hct 33.0; mcv 88.5 ;plt 147; glucose 98; bun 20; creat 1.02; k+3.6 Na++ 142; liver normal albumin 3.7; hgb a1c 6.0 12-13-12: wbc 7.9; hgb 10.1; hct 28.7; mcv 87.5; plt 149; glucose 88; bun 30; creat 0.98; k+3.7;  Na++ 138; liver normal albumin 3.0 01-06-13: tsh 4.815 01-07-13; urine culture: yeast    Review of Systems  Constitutional: Negative for malaise/fatigue.  Respiratory: Negative for cough and shortness of breath.   Cardiovascular: Negative for chest pain and palpitations.  Gastrointestinal: Negative for  heartburn, abdominal pain and constipation.  Musculoskeletal: Negative for myalgias and joint pain.  Skin: Negative.   Neurological: Negative for headaches.  Psychiatric/Behavioral: Negative for depression. The patient does not have insomnia.    Physical Exam  Constitutional: He appears well-developed and well-nourished.  Neck: Neck supple. No tracheal deviation present. No thyromegaly present.  Cardiovascular: Normal rate, regular rhythm and intact distal pulses.   Respiratory: Effort normal and breath sounds normal. No respiratory distress. He has no wheezes.  GI: Soft. Bowel sounds are normal. He exhibits no distension. There is no tenderness.  Peg tube present  Musculoskeletal: Normal range of motion. He exhibits no edema.  Neurological: He is alert.  Skin: Skin is warm and dry.  Psychiatric: He has a normal mood and affect.       ASSESSMENT/ PLAN:  Diabetes mellitus type 2 in nonobese Is stable will continue levemir 10 units daily and humalog twice daily 3 units for cbg >200 will monitor   Dysphagia, unspecified(787.20) No signs of aspiration present. Is not requiring the use of the peg tube for nutritional support. Does require nectar thick liquids; will continue current plan and will monitor his status   GERD (gastroesophageal reflux disease) Will continue pepcid 20 mg twice daily  Hypothyroidism Will continue synthroid 75 mcg daily   Depressive type psychosis His mood state is stable will continue zoloft 75 mg daily; seroquel 50 mg twice daily; klonopin 0.5 mg twice daily as needed for anxiety and depakote 250 mg twice daily and 125 mg in the afternoon will monitor  BPH (benign prostatic hyperplasia) Will continue flomax 0.4 mg daily   Essential hypertension, benign Will continue coreg 12.5 mg twice daily and will continue to monitor  Seizures No reports of seizure activity present will continue keppra 500 mg twice daily and will monitor  Unspecified  constipation Will continue colace twice daily   Urinary retention Is voiding without difficulty; will continue flomax 0.4 mg daily   Insomnia Will continue trazodone 100 mg nightly     Time spent with patient 50 minutes.

## 2013-02-08 NOTE — Assessment & Plan Note (Signed)
No reports of seizure activity present will continue keppra 500 mg twice daily and will monitor

## 2013-02-08 NOTE — Assessment & Plan Note (Signed)
Will continue trazodone 100 mg nightly

## 2013-02-08 NOTE — Assessment & Plan Note (Signed)
Will continue synthroid 75 mcg daily  

## 2013-02-08 NOTE — Assessment & Plan Note (Signed)
Will continue colace twice daily 

## 2013-02-08 NOTE — Assessment & Plan Note (Signed)
His mood state is stable will continue zoloft 75 mg daily; seroquel 50 mg twice daily; klonopin 0.5 mg twice daily as needed for anxiety and depakote 250 mg twice daily and 125 mg in the afternoon will monitor

## 2013-02-08 NOTE — Assessment & Plan Note (Signed)
Is stable will continue levemir 10 units daily and humalog twice daily 3 units for cbg >200 will monitor

## 2013-02-08 NOTE — Assessment & Plan Note (Signed)
Will continue pepcid 20 mg twice daily  

## 2013-02-12 ENCOUNTER — Non-Acute Institutional Stay (SKILLED_NURSING_FACILITY): Payer: Medicare Other | Admitting: Internal Medicine

## 2013-02-12 DIAGNOSIS — R1310 Dysphagia, unspecified: Secondary | ICD-10-CM

## 2013-02-12 DIAGNOSIS — F0151 Vascular dementia with behavioral disturbance: Secondary | ICD-10-CM

## 2013-02-12 DIAGNOSIS — F32A Depression, unspecified: Secondary | ICD-10-CM

## 2013-02-12 DIAGNOSIS — F323 Major depressive disorder, single episode, severe with psychotic features: Secondary | ICD-10-CM

## 2013-02-12 DIAGNOSIS — E119 Type 2 diabetes mellitus without complications: Secondary | ICD-10-CM

## 2013-02-12 DIAGNOSIS — F0153 Vascular dementia, unspecified severity, with mood disturbance: Secondary | ICD-10-CM

## 2013-02-12 DIAGNOSIS — F0150 Vascular dementia without behavioral disturbance: Secondary | ICD-10-CM

## 2013-02-12 DIAGNOSIS — F329 Major depressive disorder, single episode, unspecified: Secondary | ICD-10-CM

## 2013-02-12 NOTE — Progress Notes (Signed)
Patient ID: Ricardo [name], male   DOB: 03-05-1935, 78 y.o.   MRN: [id number]  [facility]  Chief complaint- cognitive decline  HPI 78 y/o male patient is a long term care resident of the facility. He has history of dementia. As per staff, he is less interactive these days, does not participate in activities. He is pushing himself around in a wheelchair by dragging his feet. He is under total care, eats with restorative dining. His oral intake is fair. No recent fall. No acute behavioral changes. Tolerating po feed well.   Review of Systems  Constitutional: Negative for malaise/fatigue.  Respiratory: Negative for cough and shortness of breath.   Cardiovascular: Negative for chest pain and palpitations.  Gastrointestinal: Negative for heartburn, abdominal pain and constipation.  Musculoskeletal: Negative for myalgias and joint pain.  Skin: Negative.   Neurological: Negative for headaches.  Psychiatric/Behavioral: being treated for depression. The patient does not have insomnia.   No Known Allergies  Past Medical History  Diagnosis Date  . Subdural hematoma, post-traumatic   . Chronic indwelling Foley catheter   . Dementia with aggressive behavior   . Frequent falls   . H/O alcohol abuse   . DM (diabetes mellitus)   . HTN (hypertension)   . Sinus tachycardia   . Seizures   . GERD (gastroesophageal reflux disease)     Physical Exam   VSS, afebrile  Constitutional: He appears well-developed and well-nourished.  Neck: Neck supple. No tracheal deviation present. No thyromegaly present.  Cardiovascular: Normal rate, regular rhythm and intact distal pulses.   Respiratory: Effort normal and breath sounds normal. No respiratory distress. He has no wheezes.  GI: Soft. Bowel sounds are normal. He exhibits no distension. There is no tenderness.  Peg tube present and site around peg tube is clean Musculoskeletal: Normal range of motion. He exhibits no edema. On a  wheelchair Neurological: He is alert but not oriented to person , place or time. Pleasantly confused Skin: Skin is warm and dry.  Psychiatric: He has a normal mood and affect.   LABS REVIEWED  11-17-12: wbc 8.4; hgb 11.1; hct 33.0; mcv 88.5 ;plt 147; glucose 98; bun 20; creat 1.02; k+3.6 Na++ 142; liver normal albumin 3.7; hgb a1c 6.0 12-13-12: wbc 7.9; hgb 10.1; hct 28.7; mcv 87.5; plt 149; glucose 88; bun 30; creat 0.98; k+3.7;   Na++ 138; liver normal albumin 3.0 01-06-13: tsh 4.815 01-07-13; urine culture: yeast    MMSE 2/30  ASSESSMENT/ PLAN:  Dementia vascular and senile dementia present. bp under control. Sugar stable. Will start him on exelon patch 4.6 mg/24hr for now and in 4 weeks go upto 9.6 mg/24hr and assess further for dose adjustment. He requires total care due to his cognitive deficit and does not have the capacity to handle his financial matters independently  Diabetes mellitus type 2 in nonobese Is stable and will continue levemir 10 units daily and humalog twice daily 3 units for cbg >200 will monitor   Dysphagia, unspecified No signs of aspiration present. continue nectar thick liquids for now with aspiration precautions  Depressive type psychosis His mood is stable with current regimen. Will not make changes for now

## 2013-03-01 NOTE — Progress Notes (Signed)
This encounter was created in error - please disregard.

## 2013-03-29 ENCOUNTER — Other Ambulatory Visit: Payer: Self-pay | Admitting: *Deleted

## 2013-03-29 MED ORDER — AMBULATORY NON FORMULARY MEDICATION: Status: DC

## 2013-04-07 ENCOUNTER — Encounter: Payer: Self-pay | Admitting: *Deleted

## 2013-04-13 ENCOUNTER — Other Ambulatory Visit (HOSPITAL_COMMUNITY): Payer: Self-pay | Admitting: Internal Medicine

## 2013-04-13 DIAGNOSIS — R1310 Dysphagia, unspecified: Secondary | ICD-10-CM

## 2013-04-13 DIAGNOSIS — Z431 Encounter for attention to gastrostomy: Secondary | ICD-10-CM

## 2013-04-16 ENCOUNTER — Ambulatory Visit (HOSPITAL_COMMUNITY)
Admission: RE | Admit: 2013-04-16 | Discharge: 2013-04-16 | Disposition: A | Discharge status: Home / Self Care | Payer: Medicare Other | Source: Ambulatory Visit | Attending: Internal Medicine | Admitting: Internal Medicine

## 2013-04-16 DIAGNOSIS — R1313 Dysphagia, pharyngeal phase: Secondary | ICD-10-CM | POA: Insufficient documentation

## 2013-04-16 DIAGNOSIS — Z431 Encounter for attention to gastrostomy: Secondary | ICD-10-CM

## 2013-04-16 DIAGNOSIS — R1310 Dysphagia, unspecified: Secondary | ICD-10-CM

## 2013-04-16 DIAGNOSIS — R1311 Dysphagia, oral phase: Secondary | ICD-10-CM | POA: Insufficient documentation

## 2013-04-19 ENCOUNTER — Other Ambulatory Visit: Payer: Self-pay | Admitting: *Deleted

## 2013-04-19 MED ORDER — LORAZEPAM 2 MG/ML IJ SOLN: INTRAMUSCULAR | Status: DC

## 2013-04-19 NOTE — Procedures (Signed)
Objective Swallowing Evaluation: Modified Barium Swallowing Study  Patient Details  Name: Ricardo [name] MRN: [id number] Date of Birth: 1935-06-06  Today's Date: 04/19/2013 LATE ENTRY, Pt seen on 04/16/13 Time: 1100-1130 SLP Time Calculation (min): 30 min  Past Medical History:  Past Medical History  Diagnosis Date  . Subdural hematoma, post-traumatic   . Chronic indwelling Foley catheter   . Dementia with aggressive behavior   . Frequent falls   . H/O alcohol abuse   . DM (diabetes mellitus)   . HTN (hypertension)   . Sinus tachycardia   . Seizures   . GERD (gastroesophageal reflux disease)    Past Surgical History: No past surgical history on file. HPI:  Pt is a 78 year old male arriving with a Guardian who is helping out his normal guardian who was not available. His available history is limited and he is unable to provide history due to dementia. He apparently has a history of a subdural hemmorhage and PEG tube. There are treatment notes from last months tx with SLP. Pt was recommended to consume thin liquids and regular solids and MD questions if pt is ready for d/c of PEG.      Assessment / Plan / Recommendation Clinical Impression  Dysphagia Diagnosis: Mild pharyngeal phase dysphagia;Mild oral phase dysphagia Clinical impression: Pt presents with a mild oral dysphagia with prolonged mastication of regular solids. Pharyngeal dysphagia characterized by sensory deficits resulting in delayed swallow initiation with penetration and silent aspiraiton of thin liquids before the swallow. Pt is recommended to consume nectar thick liquids and dys 3 (mechanical soft solids), no straws. Though diet is modified, dysphagia is not so limiting that pt would require alternate nutrition. Would defer further recommendation regarding PEG to MD and dietician. Pt does show potential to better tolerate thin liquids if completing a chin tuck. He will need f/u with SLP to attempt this strategy or at least  full supervision from staff.     Treatment Recommendation  Defer treatment plan to SLP at (Comment)    Diet Recommendation Dysphagia 3 (Mechanical Soft);Nectar-thick liquid   Liquid Administration via: Cup;No straw Medication Administration: Whole meds with puree Supervision: Full supervision/cueing for compensatory strategies;Patient able to self feed Postural Changes and/or Swallow Maneuvers: Seated upright 90 degrees;Chin tuck    Other  Recommendations     Follow Up Recommendations  Skilled Nursing facility    Frequency and Duration        Pertinent Vitals/Pain NA    SLP Swallow Goals     General HPI: Pt is a 78 year old male arriving with a Guardian who is helping out his normal guardian who was not available. His available history is limited and he is unable to provide history due to dementia. He apparently has a history of a subdural hemmorhage and PEG tube. There are treatment notes from last months tx with SLP. Pt was recommended to consume thin liquids and regular solids and MD questions if pt is ready for d/c of PEG.  Type of Study: Modified Barium Swallowing Study Reason for Referral: Objectively evaluate swallowing function Diet Prior to this Study: Regular;Thin liquids;PEG tube Temperature Spikes Noted: N/A Respiratory Status: Room air History of Recent Intubation: No Behavior/Cognition: Alert;Cooperative;Confused Oral Motor / Sensory Function: Within functional limits Self-Feeding Abilities: Able to feed self Patient Positioning: Upright in chair Baseline Vocal Quality: Clear Volitional Cough: Strong Volitional Swallow: Able to elicit Anatomy: Within functional limits    Reason for Referral Objectively evaluate swallowing function   Oral  Phase Oral Preparation/Oral Phase Oral Phase: Impaired Oral Phase - Comment Oral Phase - Comment: prolonged mastication of regular solids. brief holding of liquids with adequate oral containment   Pharyngeal Phase  Pharyngeal Phase Pharyngeal Phase: Impaired Pharyngeal - Nectar Pharyngeal - Nectar Cup: Trace aspiration;Penetration/Aspiration before swallow;Delayed swallow initiation Penetration/Aspiration details (nectar cup): Material enters airway, CONTACTS cords then ejected out;Material enters airway, CONTACTS cords and not ejected out;Material does not enter airway Pharyngeal - Thin Pharyngeal - Thin Cup: Delayed swallow initiation;Penetration/Aspiration before swallow;Moderate aspiration Penetration/Aspiration details (thin cup): Material enters airway, passes BELOW cords without attempt by patient to eject out (silent aspiration);Material does not enter airway Pharyngeal - Thin Straw: Penetration/Aspiration before swallow;Delayed swallow initiation;Moderate aspiration Pharyngeal - Solids Pharyngeal - Puree: Delayed swallow initiation Pharyngeal - Regular: Within functional limits  Cervical Esophageal Phase    GO    Cervical Esophageal Phase Cervical Esophageal Phase: [facility]    Functional Assessment Tool Used: clinical judgement Functional Limitations: Swallowing Swallow Current Status (R6045): At least 20 percent but less than 40 percent impaired, limited or restricted Swallow Goal Status [phone]): At least 20 percent but less than 40 percent impaired, limited or restricted Swallow Discharge Status [phone]): At least 20 percent but less than 40 percent impaired, limited or restricted   [facility], [place] CCC-SLP [phone]  Claudine Mouton 04/19/2013, 12:59 PM

## 2013-04-20 NOTE — Progress Notes (Signed)
Quick Note:  To review SLP recommendation (i will do that in nursing home) ______

## 2013-05-06 ENCOUNTER — Other Ambulatory Visit: Payer: Self-pay

## 2013-05-06 MED ORDER — LORAZEPAM 2 MG/ML IJ SOLN: INTRAMUSCULAR | Status: DC

## 2013-05-13 ENCOUNTER — Non-Acute Institutional Stay (SKILLED_NURSING_FACILITY): Payer: Medicare Other | Admitting: Internal Medicine

## 2013-05-13 DIAGNOSIS — F0150 Vascular dementia without behavioral disturbance: Secondary | ICD-10-CM

## 2013-05-13 DIAGNOSIS — E039 Hypothyroidism, unspecified: Secondary | ICD-10-CM

## 2013-05-13 DIAGNOSIS — K219 Gastro-esophageal reflux disease without esophagitis: Secondary | ICD-10-CM

## 2013-05-13 DIAGNOSIS — I10 Essential (primary) hypertension: Secondary | ICD-10-CM

## 2013-05-13 DIAGNOSIS — E119 Type 2 diabetes mellitus without complications: Secondary | ICD-10-CM

## 2013-05-13 DIAGNOSIS — F0151 Vascular dementia with behavioral disturbance: Secondary | ICD-10-CM

## 2013-05-13 DIAGNOSIS — F323 Major depressive disorder, single episode, severe with psychotic features: Secondary | ICD-10-CM

## 2013-05-13 DIAGNOSIS — R1310 Dysphagia, unspecified: Secondary | ICD-10-CM

## 2013-05-13 DIAGNOSIS — R569 Unspecified convulsions: Secondary | ICD-10-CM

## 2013-05-13 NOTE — Progress Notes (Signed)
Patient ID: Ricardo [name], male   DOB: 09-12-1934, 78 y.o.   MRN: [id number]  [facility]  Code- full code  No Known Allergies  HPI 78 y/o male patient is a long term resident of the facility seen today for routine visit. He denies any complaints. No new concerns form staff. Mood remains stable recently. He is pushing himself around in a wheelchair by dragging his feet. He is under total care, eats with restorative dining. His oral intake is fair. No recent fall.   Review of Systems   Constitutional: Negative for malaise/fatigue.   Respiratory: Negative for cough and shortness of breath.    Cardiovascular: Negative for chest pain and palpitations.   Gastrointestinal: Negative for heartburn, abdominal pain and constipation.   Musculoskeletal: Negative for myalgias and joint pain.   Skin: Negative.    Neurological: Negative for headaches.   Psychiatric/Behavioral: being treated for depression. The patient does not have insomnia.   Past Medical History  Diagnosis Date  . Subdural hematoma, post-traumatic   . Chronic indwelling Foley catheter   . Dementia with aggressive behavior   . Frequent falls   . H/O alcohol abuse   . DM (diabetes mellitus)   . HTN (hypertension)   . Sinus tachycardia   . Seizures   . GERD (gastroesophageal reflux disease)    No past surgical history on file.  Current Outpatient Prescriptions on File Prior to Visit  Medication Sig Dispense Refill  . acetaminophen (TYLENOL) 325 MG tablet 650 mg by PEG Tube route every 8 (eight) hours as needed. For fever/pain      . albuterol (PROVENTIL) (2.5 MG/3ML) 0.083% nebulizer solution Take 2.5 mg by nebulization every 4 (four) hours as needed. For shortness of breath      . carvedilol (COREG) 12.5 MG tablet 12.5 mg by PEG Tube route 2 (two) times daily with a meal.      . divalproex (DEPAKOTE SPRINKLE) 125 MG capsule Take 125 mg by mouth 2 (two) times daily. Take 2 caps in morning and afternoon and 4 caps at  bedtime      . docusate (COLACE) 50 MG/5ML liquid Take 100 mg by mouth 2 (two) times daily.      . famotidine (PEPCID) 20 MG tablet Take 20 mg by mouth 2 (two) times daily.       . insulin detemir (LEVEMIR) 100 UNIT/ML injection Inject 8 Units into the skin at bedtime.       . insulin lispro (HUMALOG) 100 UNIT/ML injection Inject 3 Units into the skin 2 (two) times daily before a meal. Per Sliding scale      . levETIRAcetam (KEPPRA) 500 MG tablet Take 500 mg by mouth 2 (two) times daily.       Marland Kitchen levothyroxine (SYNTHROID, LEVOTHROID) 75 MCG tablet Take 75 mcg by mouth daily before breakfast.      . QUEtiapine (SEROQUEL) 50 MG tablet Take 100 mg by mouth 2 (two) times daily.       . sertraline (ZOLOFT) 25 MG tablet Take 75 mg by mouth daily.       . tamsulosin (FLOMAX) 0.4 MG CAPS Take 0.4 mg by mouth daily.      Marland Kitchen thiamine (VITAMIN B-1) 100 MG tablet Take 100 mg by mouth 2 (two) times daily.       No current facility-administered medications on file prior to visit.    Physical exam  BP 108/72  Pulse 60  Temp(Src) 98 F (36.7 C)  Resp  18  SpO2 98%  Constitutional: He appears well-developed and well-nourished.   Neck: Neck supple. No tracheal deviation present. No thyromegaly present.   Cardiovascular: Normal rate, regular rhythm and intact distal pulses.    Respiratory: Effort normal and breath sounds normal. No respiratory distress. He has no wheezes.   GI: Soft. Bowel sounds are normal. He exhibits no distension. There is no tenderness.  Peg tube present and site around peg tube is clean Musculoskeletal: Normal range of motion. He exhibits no edema. On a wheelchair Neurological: He is alert but not oriented to person , place or time. Pleasantly confused Skin: Skin is warm and dry.  Psychiatric: He has a normal mood and affect.   LABS REVIEWED  11-17-12: wbc 8.4; hgb 11.1; hct 33.0; mcv 88.5 ;plt 147; glucose 98; bun 20; creat 1.02; k+3.6 Na++ 142; liver normal albumin 3.7; hgb  a1c 6.0 12-13-12: wbc 7.9; hgb 10.1; hct 28.7; mcv 87.5; plt 149; glucose 88; bun 30; creat 0.98; k+3.7;   Na++ 138; liver normal albumin 3.0 01-06-13: tsh 4.815 01-07-13; urine culture: yeast   02-24-13 a1c 5.1 05-04-13 na 140, k 4.5, bun 29, cr 1.1, glu 95 04-21-13 tsh 3.4610 04-19-13 wbc 6.7, hb 11.8, hct 35.9, plt 156  MMSE 2/30  ASSESSMENT/ PLAN:  Dementia vascular and senile dementia present. bp under control. Sugar stable. Off all medication at present. Continue seroquel and zoloft for his mood  Depressive type psychosis His mood is stable with current regimen of depakote and keppra- dose adjustment recently made. Off klonopin for now. Also to continue zoloft and seroquel. Followed by psych services  Diabetes mellitus type 2 in nonobese Is stable with a1c 5.1 and cbg < 150. Will d/c levemir and have him on humalog 5 u for cbg > 200 only. Recheck a1c  Dysphagia, unspecified No signs of aspiration present. continue nectar thick liquids for now with aspiration precautions. Continue pepcid. Pending removal of peg tube  Hypothyroidism continue levothyroxine and check tsh  BPH Continue flomax, monitor clinically  Hypertension Continue coreg 12.5 mg bid, monitor bp  Seizures Remains seizure free, continue his keppra

## 2013-05-31 ENCOUNTER — Other Ambulatory Visit: Payer: Self-pay

## 2013-05-31 MED ORDER — AMBULATORY NON FORMULARY MEDICATION: Status: DC

## 2013-06-28 ENCOUNTER — Other Ambulatory Visit: Payer: Self-pay | Admitting: *Deleted

## 2013-06-28 MED ORDER — AMBULATORY NON FORMULARY MEDICATION: Status: DC

## 2013-06-28 NOTE — Telephone Encounter (Signed)
rx filled per protocol  

## 2013-07-15 ENCOUNTER — Non-Acute Institutional Stay (SKILLED_NURSING_FACILITY): Payer: Medicare Other | Admitting: Adult Health

## 2013-07-15 DIAGNOSIS — E119 Type 2 diabetes mellitus without complications: Secondary | ICD-10-CM

## 2013-07-15 DIAGNOSIS — F32A Depression, unspecified: Secondary | ICD-10-CM

## 2013-07-15 DIAGNOSIS — F329 Major depressive disorder, single episode, unspecified: Secondary | ICD-10-CM

## 2013-07-15 DIAGNOSIS — F0153 Vascular dementia, unspecified severity, with mood disturbance: Secondary | ICD-10-CM

## 2013-07-15 DIAGNOSIS — R1310 Dysphagia, unspecified: Secondary | ICD-10-CM

## 2013-07-15 DIAGNOSIS — K219 Gastro-esophageal reflux disease without esophagitis: Secondary | ICD-10-CM

## 2013-07-15 DIAGNOSIS — I10 Essential (primary) hypertension: Secondary | ICD-10-CM

## 2013-07-15 DIAGNOSIS — F0150 Vascular dementia without behavioral disturbance: Secondary | ICD-10-CM

## 2013-07-15 DIAGNOSIS — F0151 Vascular dementia with behavioral disturbance: Secondary | ICD-10-CM

## 2013-07-15 DIAGNOSIS — N400 Enlarged prostate without lower urinary tract symptoms: Secondary | ICD-10-CM

## 2013-07-15 DIAGNOSIS — R569 Unspecified convulsions: Secondary | ICD-10-CM

## 2013-07-15 DIAGNOSIS — E039 Hypothyroidism, unspecified: Secondary | ICD-10-CM

## 2013-07-15 DIAGNOSIS — F323 Major depressive disorder, single episode, severe with psychotic features: Secondary | ICD-10-CM

## 2013-07-16 ENCOUNTER — Other Ambulatory Visit: Payer: Self-pay | Admitting: *Deleted

## 2013-07-16 MED ORDER — AMBULATORY NON FORMULARY MEDICATION: Status: DC

## 2013-07-19 ENCOUNTER — Encounter: Payer: Self-pay | Admitting: Adult Health

## 2013-07-19 DIAGNOSIS — N400 Enlarged prostate without lower urinary tract symptoms: Secondary | ICD-10-CM | POA: Insufficient documentation

## 2013-07-19 MED ORDER — FAMOTIDINE 20 MG PO TABS
20.0000 mg | ORAL_TABLET | Freq: Every day | ORAL | Status: AC
Start: 2013-07-19 — End: ?

## 2013-07-19 MED ORDER — VITAMIN B-1 100 MG PO TABS: 100.0000 mg | ORAL_TABLET | Freq: Every day | ORAL | Status: AC

## 2013-07-19 MED ORDER — AMBULATORY NON FORMULARY MEDICATION: 0.5000 mg | Freq: Two times a day (BID) | Status: DC

## 2013-07-19 NOTE — Progress Notes (Signed)
Patient ID: Ricardo [name], male   DOB: Jun 27, 1935, 79 y.o.   MRN: [id number]     [facility]  No Known Allergies   Chief Complaint  Patient presents with  . Medical Managment of Chronic Issues    HPI:   He is being seen for the management of his chronic illnesses. Overall his status is without change. Nursing staff reports that he will decline his medication several times weekly. He is not voicing any complaints or concerns at this time stating that he feels good.   Past Medical History  Diagnosis Date  . Subdural hematoma, post-traumatic   . Chronic indwelling Foley catheter   . Dementia with aggressive behavior   . Frequent falls   . H/O alcohol abuse   . DM (diabetes mellitus)   . HTN (hypertension)   . Sinus tachycardia   . Seizures   . GERD (gastroesophageal reflux disease)     No past surgical history on file.  VITAL SIGNS BP 127/77  Pulse 73  Ht 5\' 7"  (1.702 m)  Wt 144 lb 3.2 oz (65.409 kg)  BMI 22.58 kg/m2   Patient's Medications  New Prescriptions   No medications on file  Previous Medications   ACETAMINOPHEN (TYLENOL) 325 MG TABLET    650 mg by PEG Tube route every 8 (eight) hours as needed. For fever/pain   ALBUTEROL (PROVENTIL) (2.5 MG/3ML) 0.083% NEBULIZER SOLUTION    Take 2.5 mg by nebulization every 4 (four) hours as needed. For shortness of breath   CARVEDILOL (COREG) 12.5 MG TABLET    12.5 mg by PEG Tube route 2 (two) times daily with a meal.   FAMOTIDINE (PEPCID) 20 MG TABLET    Take 20 mg by mouth 2 (two) times daily.    FOOD THICKENER (THICK IT) POWD    Take 1 Container by mouth as needed. Nectar thick   INSULIN LISPRO (HUMALOG) 100 UNIT/ML INJECTION    Inject 5 Units into the skin 3 (three) times daily with meals. For cbg >200   LEVETIRACETAM (KEPPRA) 500 MG TABLET    Take 500 mg by mouth 2 (two) times daily.    LEVOTHYROXINE (SYNTHROID, LEVOTHROID) 75 MCG TABLET    Take 75 mcg by mouth daily before breakfast.   RIVASTIGMINE (EXELON) 9.5  MG/24HR    Place 9.5 mg onto the skin daily.   SERTRALINE (ZOLOFT) 25 MG TABLET    Take 75 mg by mouth daily.    TAMSULOSIN (FLOMAX) 0.4 MG CAPS    Take 0.4 mg by mouth daily.   THIAMINE (VITAMIN B-1) 100 MG TABLET    Take 100 mg by mouth 2 (two) times daily.  Modified Medications   Modified Medication Previous Medication   AMBULATORY NON FORMULARY MEDICATION AMBULATORY NON FORMULARY MEDICATION      Apply 0.5 mg topically. Medication Name: Lorazepam 0.5 mg/ml gel Apply 0.5 mg/ml = 1 ml topically every 6 hours as needed for agitation Daily routinely    Medication Name: Lorazepam 0.5 mg/ml gel Apply 0.5 mg/ml = 1 ml topically every 6 hours as needed for agitation  Discontinued Medications   DIVALPROEX (DEPAKOTE SPRINKLE) 125 MG CAPSULE    Take 125 mg by mouth 2 (two) times daily. Take 2 caps in morning and afternoon and 4 caps at bedtime   DOCUSATE (COLACE) 50 MG/5ML LIQUID    Take 100 mg by mouth 2 (two) times daily.   INSULIN DETEMIR (LEVEMIR) 100 UNIT/ML INJECTION    Inject 8 Units into the skin  at bedtime.    QUETIAPINE (SEROQUEL) 50 MG TABLET    Take 100 mg by mouth 2 (two) times daily.     SIGNIFICANT DIAGNOSTIC EXAMS   LABS REVIEWED  11-17-12: wbc 8.4; hgb 11.1; hct 33.0; mcv 88.5 ;plt 147; glucose 98; bun 20; creat 1.02; k+3.6 Na++ 142; liver normal albumin 3.7; hgb a1c 6.0 12-13-12: wbc 7.9; hgb 10.1; hct 28.7; mcv 87.5; plt 149; glucose 88; bun 30; creat 0.98; k+3.7;  Na++ 138; liver normal albumin 3.0 01-06-13: tsh 4.815 01-07-13; urine culture: yeast 03-25-13: liver normal albumin 3.7  04-21-13: tsh 3.461 05-11-13: wbc 7.5; hgb 11.4; hct 36.2; mcv 96.3; plt 156  05-17-13: tsh 3.223 05-31-13: glucose 125; bun 27; creat 0.9; k+4.1; na++ 141; liver normal albumin 4.1; depakote 32.5  06-02-13: wbc 6.4; hgb 12.0; hct 35.8; mcv 90.9; plt 120 06-16-13: liver normal albumin 3.9; depakote 62.1     Review of Systems  Constitutional: Negative for malaise/fatigue.  Respiratory: Negative  for cough and shortness of breath.   Cardiovascular: Negative for chest pain and palpitations.  Gastrointestinal: Negative for heartburn, abdominal pain and constipation.  Musculoskeletal: Negative for myalgias and joint pain.  Skin: Negative.   Neurological: Negative for headaches.  Psychiatric/Behavioral: Negative for depression. The patient does not have insomnia.      Physical Exam  Constitutional: He appears well-developed and well-nourished.  Neck: Neck supple. No tracheal deviation present. No thyromegaly present.  Cardiovascular: Normal rate, regular rhythm and intact distal pulses.   Respiratory: Effort normal and breath sounds normal. No respiratory distress. He has no wheezes.  GI: Soft. Bowel sounds are normal. He exhibits no distension. There is no tenderness.  Musculoskeletal: Normal range of motion. He exhibits no edema.  Neurological: He is alert.  Skin: Skin is warm and dry.  Psychiatric: He has a normal mood and affect.    ASSESSMENT/ PLAN:  1. Dysphagia: no signs of aspiration present; will continue nectar thick liquids and will continue to monitor his status  2. Seizures: no reports of seizure activity present; will continue keppra 50 mg twice daliy and will monitor his status   3. Bph: is he taking flomax 0.4 mg daily; this medication cannot be crushed; and will be stopped will monitor his status   4. Hypothyroidism: is stable will continue synthroid 75 mcg daily  5. Dementia: is without change in status; will continue exelon patch 9.5 mg daily and will monitor  6. Hypertension: is stable will continue coreg 12.5 mg twice daily and will monitor  7. Genella RifeGerd: is stable will lower his pepcid to one time daily and will monitor  8. Diabetes: is stable will continue humalog prior to meals for cbg >200  9. Depressive type psychosis: is without change; will continue zoloft 75 mg daily will change the ativan gel to 0.5 mg twice daily and every 6 hours as needed and  will monitor his status   Time spent with patient 45 minutes.

## 2013-07-28 ENCOUNTER — Other Ambulatory Visit: Payer: Self-pay | Admitting: *Deleted

## 2013-07-28 DIAGNOSIS — F323 Major depressive disorder, single episode, severe with psychotic features: Secondary | ICD-10-CM

## 2013-07-28 DIAGNOSIS — F32A Depression, unspecified: Secondary | ICD-10-CM

## 2013-07-28 MED ORDER — AMBULATORY NON FORMULARY MEDICATION: Status: DC

## 2013-08-09 LAB — HM DIABETES EYE EXAM

## 2013-08-13 ENCOUNTER — Non-Acute Institutional Stay (SKILLED_NURSING_FACILITY): Payer: Medicare Other | Admitting: Adult Health

## 2013-08-13 DIAGNOSIS — E039 Hypothyroidism, unspecified: Secondary | ICD-10-CM

## 2013-08-13 DIAGNOSIS — N400 Enlarged prostate without lower urinary tract symptoms: Secondary | ICD-10-CM

## 2013-08-13 DIAGNOSIS — F323 Major depressive disorder, single episode, severe with psychotic features: Secondary | ICD-10-CM

## 2013-08-13 DIAGNOSIS — I10 Essential (primary) hypertension: Secondary | ICD-10-CM

## 2013-08-13 DIAGNOSIS — E119 Type 2 diabetes mellitus without complications: Secondary | ICD-10-CM

## 2013-08-13 DIAGNOSIS — F32A Depression, unspecified: Secondary | ICD-10-CM

## 2013-08-13 DIAGNOSIS — K219 Gastro-esophageal reflux disease without esophagitis: Secondary | ICD-10-CM

## 2013-08-13 DIAGNOSIS — R1310 Dysphagia, unspecified: Secondary | ICD-10-CM

## 2013-08-13 DIAGNOSIS — R569 Unspecified convulsions: Secondary | ICD-10-CM

## 2013-08-15 ENCOUNTER — Encounter: Payer: Self-pay | Admitting: Adult Health

## 2013-08-15 NOTE — Progress Notes (Signed)
Patient ID: Ricardo BrickGeorge [name], male   DOB: 1934/12/08, 79 y.o.   MRN: [id number]     [facility]  No Known Allergies   Chief Complaint  Patient presents with  . Medical Managment of Chronic Issues    HPI:  He is being seen for the management of his chronic illnesses. He had increased behavior issues this past month. His behaviors included verbal and physical aggression. He was not taking his pm medications. The times of medications was changed; his behaviors has started to improved. There are no concerns being voiced by the nursing staff at this time.    Past Medical History  Diagnosis Date  . Subdural hematoma, post-traumatic   . Chronic indwelling Foley catheter   . Dementia with aggressive behavior   . Frequent falls   . H/O alcohol abuse   . DM (diabetes mellitus)   . HTN (hypertension)   . Sinus tachycardia   . Seizures   . GERD (gastroesophageal reflux disease)     No past surgical history on file.  VITAL SIGNS BP 105/56  Pulse 63  Ht 5\' 7"  (1.702 m)  Wt 144 lb 3.2 oz (65.409 kg)  BMI 22.58 kg/m2   Patient's Medications  New Prescriptions   No medications on file  Previous Medications   ACETAMINOPHEN (TYLENOL) 325 MG TABLET    650 mg by PEG Tube route every 8 (eight) hours as needed. For fever/pain   ALBUTEROL (PROVENTIL) (2.5 MG/3ML) 0.083% NEBULIZER SOLUTION    Take 2.5 mg by nebulization every 4 (four) hours as needed. For shortness of breath   AMBULATORY NON FORMULARY MEDICATION    Medication Name: Lorazepam 0.5 mg/ml gel Sig: Apply 1ml topically every 6 hours as needed for agitation   CARVEDILOL (COREG) 12.5 MG TABLET    12.5 mg by PEG Tube route 2 (two) times daily with a meal.   FAMOTIDINE (PEPCID) 20 MG TABLET    Take 1 tablet (20 mg total) by mouth daily.   FOOD THICKENER (THICK IT) POWD    Take 1 Container by mouth as needed. Nectar thick   INSULIN LISPRO (HUMALOG) 100 UNIT/ML INJECTION    Inject 5 Units into the skin 3 (three) times daily with meals.  For cbg >200   LEVETIRACETAM (KEPPRA) 500 MG TABLET    Take 500 mg by mouth 2 (two) times daily.    LEVOTHYROXINE (SYNTHROID, LEVOTHROID) 75 MCG TABLET    Take 75 mcg by mouth daily before breakfast.   RIVASTIGMINE (EXELON) 9.5 MG/24HR    Place 9.5 mg onto the skin daily.   SERTRALINE (ZOLOFT) 25 MG TABLET    Take 75 mg by mouth daily.    THIAMINE (VITAMIN B-1) 100 MG TABLET    Take 1 tablet (100 mg total) by mouth daily.  Modified Medications   No medications on file  Discontinued Medications   No medications on file    SIGNIFICANT DIAGNOSTIC EXAMS  LABS REVIEWED  11-17-12: wbc 8.4; hgb 11.1; hct 33.0; mcv 88.5 ;plt 147; glucose 98; bun 20; creat 1.02; k+3.6 Na++ 142; liver normal albumin 3.7; hgb a1c 6.0 12-13-12: wbc 7.9; hgb 10.1; hct 28.7; mcv 87.5; plt 149; glucose 88; bun 30; creat 0.98; k+3.7;  Na++ 138; liver normal albumin 3.0 01-06-13: tsh 4.815 01-07-13; urine culture: yeast 03-25-13: liver normal albumin 3.7  04-21-13: tsh 3.461 05-11-13: wbc 7.5; hgb 11.4; hct 36.2; mcv 96.3; plt 156  05-17-13: tsh 3.223 05-31-13: glucose 125; bun 27; creat 0.9; k+4.1; na++ 141; liver  normal albumin 4.1; depakote 32.5  06-02-13: wbc 6.4; hgb 12.0; hct 35.8; mcv 90.9; plt 120 06-16-13: liver normal albumin 3.9; depakote 62.1  07-27-13: tsh 8.701     Review of Systems  Constitutional: Negative for malaise/fatigue.  Respiratory: Negative for cough and shortness of breath.   Cardiovascular: Negative for chest pain and palpitations.  Gastrointestinal: Negative for heartburn, abdominal pain and constipation.  Musculoskeletal: Negative for myalgias and joint pain.  Skin: Negative.   Neurological: Negative for headaches.  Psychiatric/Behavioral: Negative for depression. The patient does not have insomnia.      Physical Exam  Constitutional: He appears well-developed and well-nourished.  Neck: Neck supple. No tracheal deviation present. No thyromegaly present.  Cardiovascular: Normal rate,  regular rhythm and intact distal pulses.   Respiratory: Effort normal and breath sounds normal. No respiratory distress. He has no wheezes.  GI: Soft. Bowel sounds are normal. He exhibits no distension. There is no tenderness.  Musculoskeletal: Normal range of motion. He exhibits no edema.  Neurological: He is alert.  Skin: Skin is warm and dry.  Psychiatric: He has a normal mood and affect.    ASSESSMENT/ PLAN:  1. Dysphagia: no signs of aspiration present; will continue nectar thick liquids and will continue to monitor his status  2. Seizures: no reports of seizure activity present; will continue keppra 50 mg twice daliy and will monitor his status   3. Bph: is he taking flomax 0.4 mg daily; this medication cannot be crushed; and will be stopped will monitor his status   4. Hypothyroidism: is without change will continue synthroid 100 mcg daily  5. Dementia: is without change in status; will continue exelon patch 9.5 mg daily and will monitor  6. Hypertension: is stable will continue coreg 12.5 mg twice daily and will monitor  7. Genella Rife: is stable will lower his pepcid to one time daily and will monitor  8. Diabetes: is stable will continue humalog prior to meals for cbg >200  9. Depressive type psychosis: is without change; will continue zoloft 75 mg daily will change the ativan gel to 0.5 mg twice daily and every 6 hours as needed and will monitor his status

## 2013-08-17 ENCOUNTER — Other Ambulatory Visit: Payer: Self-pay | Admitting: *Deleted

## 2013-08-17 DIAGNOSIS — F32A Depression, unspecified: Secondary | ICD-10-CM

## 2013-08-17 DIAGNOSIS — F323 Major depressive disorder, single episode, severe with psychotic features: Secondary | ICD-10-CM

## 2013-08-17 MED ORDER — AMBULATORY NON FORMULARY MEDICATION: Status: AC

## 2013-08-17 MED ORDER — LORAZEPAM 2 MG/ML IJ SOLN: INTRAMUSCULAR | Status: DC

## 2013-08-17 NOTE — Telephone Encounter (Signed)
[facility] 

## 2013-09-09 ENCOUNTER — Non-Acute Institutional Stay (SKILLED_NURSING_FACILITY): Payer: Medicare Other | Admitting: Internal Medicine

## 2013-09-09 ENCOUNTER — Encounter: Payer: Self-pay | Admitting: Internal Medicine

## 2013-09-09 DIAGNOSIS — E039 Hypothyroidism, unspecified: Secondary | ICD-10-CM

## 2013-09-09 DIAGNOSIS — F0151 Vascular dementia with behavioral disturbance: Secondary | ICD-10-CM

## 2013-09-09 DIAGNOSIS — F323 Major depressive disorder, single episode, severe with psychotic features: Secondary | ICD-10-CM

## 2013-09-09 DIAGNOSIS — G309 Alzheimer's disease, unspecified: Principal | ICD-10-CM

## 2013-09-09 DIAGNOSIS — I10 Essential (primary) hypertension: Secondary | ICD-10-CM

## 2013-09-09 DIAGNOSIS — F0150 Vascular dementia without behavioral disturbance: Secondary | ICD-10-CM

## 2013-09-09 DIAGNOSIS — F0280 Dementia in other diseases classified elsewhere without behavioral disturbance: Secondary | ICD-10-CM

## 2013-09-09 DIAGNOSIS — K219 Gastro-esophageal reflux disease without esophagitis: Secondary | ICD-10-CM

## 2013-09-09 DIAGNOSIS — F02818 Dementia in other diseases classified elsewhere, unspecified severity, with other behavioral disturbance: Secondary | ICD-10-CM | POA: Insufficient documentation

## 2013-09-09 DIAGNOSIS — N400 Enlarged prostate without lower urinary tract symptoms: Secondary | ICD-10-CM

## 2013-09-09 DIAGNOSIS — R1310 Dysphagia, unspecified: Secondary | ICD-10-CM

## 2013-09-09 DIAGNOSIS — F32A Depression, unspecified: Secondary | ICD-10-CM

## 2013-09-09 DIAGNOSIS — F01518 Vascular dementia, unspecified severity, with other behavioral disturbance: Secondary | ICD-10-CM

## 2013-09-09 DIAGNOSIS — E119 Type 2 diabetes mellitus without complications: Secondary | ICD-10-CM

## 2013-09-09 DIAGNOSIS — F0281 Dementia in other diseases classified elsewhere with behavioral disturbance: Secondary | ICD-10-CM

## 2013-09-09 NOTE — Progress Notes (Signed)
Patient ID: Ricardo [name], male   DOB: 22-Nov-1934, 79 y.o.   MRN: [id number]       PCP: Oneal Grout, MD  Code Status: full code  No Known Allergies  Chief Complaint: routine visit  HPI:  79 y/o male patient is seen for routine visit. He denies any complaints. No new concerns form staff. Mood remains stable recently. He is pushing himself around in a wheelchair by dragging his feet. He is under total care, eats with restorative dining. His oral intake is fair. No recent fall.   Review of Systems   Constitutional: Negative for malaise/fatigue.   Respiratory: Negative for cough and shortness of breath.    Cardiovascular: Negative for chest pain and palpitations.   Gastrointestinal: Negative for heartburn, abdominal pain and constipation.   Musculoskeletal: Negative for myalgias and joint pain.   Skin: Negative.    Neurological: Negative for headaches.   Psychiatric/Behavioral: being treated for depression. The patient does not have insomnia.    Past Medical History  Diagnosis Date  . Subdural hematoma, post-traumatic   . Chronic indwelling Foley catheter   . Dementia with aggressive behavior   . Frequent falls   . H/O alcohol abuse   . DM (diabetes mellitus)   . HTN (hypertension)   . Sinus tachycardia   . Seizures   . GERD (gastroesophageal reflux disease)    History reviewed. No pertinent past surgical history. Social History:   reports that he has quit smoking. He does not have any smokeless tobacco history on file. His alcohol and drug histories are not on file.  No family history on file.  Medications: Patient's Medications  New Prescriptions   No medications on file  Previous Medications   ACETAMINOPHEN (TYLENOL) 325 MG TABLET    650 mg by PEG Tube route every 8 (eight) hours as needed. For fever/pain   ALBUTEROL (PROVENTIL) (2.5 MG/3ML) 0.083% NEBULIZER SOLUTION    Take 2.5 mg by nebulization every 4 (four) hours as needed. For shortness of breath   AMBULATORY NON FORMULARY MEDICATION    Medication Name: Lorazepam 0.5 mg/ml gel NWG:NFAOZ 1ml topically to skin twice daily routinely for agitation; Apply 1ml topically to skin every 6 hours as needed for agitation   CARVEDILOL (COREG) 12.5 MG TABLET    12.5 mg by PEG Tube route 2 (two) times daily with a meal.   FAMOTIDINE (PEPCID) 20 MG TABLET    Take 1 tablet (20 mg total) by mouth daily.   FOOD THICKENER (THICK IT) POWD    Take 1 Container by mouth as needed. Nectar thick   INSULIN LISPRO (HUMALOG) 100 UNIT/ML INJECTION    Inject 5 Units into the skin 3 (three) times daily with meals. For cbg >200   LEVETIRACETAM (KEPPRA) 500 MG TABLET    Take 500 mg by mouth 2 (two) times daily.    LEVOTHYROXINE (SYNTHROID, LEVOTHROID) 75 MCG TABLET    Take 75 mcg by mouth daily before breakfast.   RIVASTIGMINE (EXELON) 9.5 MG/24HR    Place 9.5 mg onto the skin daily.   SERTRALINE (ZOLOFT) 25 MG TABLET    Take 75 mg by mouth daily.    THIAMINE (VITAMIN B-1) 100 MG TABLET    Take 1 tablet (100 mg total) by mouth daily.  Modified Medications   No medications on file  Discontinued Medications   No medications on file     Physical Exam: Filed Vitals:   09/09/13 1255  BP: 148/77  Pulse: 60  Temp: 97.7  F (36.5 C)  Resp: 18  SpO2: 95%   General- elderly male in no acute distress Head- atraumatic, normocephalic Eyes- PERRLA, EOMI, no pallor, no icterus, no discharge Neck- no lymphadenopathy, no thyromegaly Cardiovascular- normal s1,s2, no murmurs/ rubs/ gallops Respiratory- bilateral clear to auscultation, no wheeze, no rhonchi, no crackles, no use of accessory muscles Abdomen- bowel sounds present, soft, non tender Musculoskeletal- able to move all 4 extremities,  no leg edema, on wheelchair Neurological- no focal deficit Skin- warm and dry Psychiatry- alert and oriented to person, place    Labs reviewed: Basic Metabolic Panel:  Recent Labs  40/98/1103/06/21 2314 12/13/12 0052  NA 140 138  K  3.7 3.7  CL 103 105  CO2 29 29  GLUCOSE 97 88  BUN 24* 30*  CREATININE 0.93 0.98  CALCIUM 9.0 9.0   Liver Function Tests:  Recent Labs  12/13/12 0052  AST 16  ALT 10  ALKPHOS 90  BILITOT 0.2*  PROT 6.3  ALBUMIN 3.0*   No results found for this basename: LIPASE, AMYLASE,  in the last 8760 hours No results found for this basename: AMMONIA,  in the last 8760 hours CBC:  Recent Labs  10/17/12 2314 12/13/12 0052  WBC 9.7 7.9  NEUTROABS 6.3 4.6  HGB 10.5* 10.1*  HCT 30.9* 28.7*  MCV 87.3 87.5  PLT 160 149*   Cardiac Enzymes: No results found for this basename: CKTOTAL, CKMB, CKMBINDEX, TROPONINI,  in the last 8760 hours BNP: No components found with this basename: POCBNP,  CBG:  Recent Labs  10/18/12 0644  GLUCAP 97   04-21-13: tsh 3.461 05-11-13: wbc 7.5; hgb 11.4; hct 36.2; mcv 96.3; plt 156   05-17-13: tsh 3.223 05-31-13: glucose 125; bun 27; creat 0.9; k+4.1; na++ 141; liver normal albumin 4.1; depakote 32.5   06-02-13: wbc 6.4; hgb 12.0; hct 35.8; mcv 90.9; plt 120 06-16-13: liver normal albumin 3.9; depakote 62.1   07-27-13: tsh 8.701  09-03-13 wbc 6.9, hb 11.3, hct 34.7, plt 121, na 143, k 3.8, glu 102, bun 27, cr 0.9 09-07-13 a1c 5.5  ASSESSMENT/ PLAN:  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 bp under control. Sugar stable. Continue exelon patch. Continue seroquel and zoloft for his mood  Hypothyroidism continue levothyroxine 100 mcg daily and check tsh in 3 months  BPH Continue flomax, monitor clinically  Diabetes mellitus type 2 in nonobese Is stable with a1c 5.5 and cbg < 150. D/c humalog for now. Check cbg once a week only.  Hypertension Continue coreg 12.5 mg bid, monitor bp  Depressive type psychosis His mood is stable with current regimen of depakote, keppra with seroquel and zoloft. Monitor.   Dysphagia, unspecified No signs of aspiration present. continue nectar thick liquids for now with  aspiration precautions. Continue pepcid.   Seizures Remains seizure free, continue his keppra  Labs/tests ordered- tsh in 3 months    Oneal GroutMAHIMA Thera Basden, MD  [facility] [phone] (Monday-Friday 8 am - 5 pm) [phone] (afterhours)

## 2013-09-13 ENCOUNTER — Other Ambulatory Visit: Payer: Self-pay | Admitting: *Deleted

## 2013-09-13 DIAGNOSIS — F32A Depression, unspecified: Secondary | ICD-10-CM

## 2013-09-13 DIAGNOSIS — F323 Major depressive disorder, single episode, severe with psychotic features: Secondary | ICD-10-CM

## 2013-09-13 MED ORDER — LORAZEPAM 2 MG/ML IJ SOLN: INTRAMUSCULAR | Status: DC

## 2013-09-13 NOTE — Telephone Encounter (Signed)
[facility] 

## 2013-09-17 ENCOUNTER — Non-Acute Institutional Stay (SKILLED_NURSING_FACILITY): Payer: Medicare Other | Admitting: Adult Health

## 2013-09-17 DIAGNOSIS — E039 Hypothyroidism, unspecified: Secondary | ICD-10-CM

## 2013-09-20 MED ORDER — LEVOTHYROXINE SODIUM 137 MCG PO TABS: 137.0000 ug | ORAL_TABLET | Freq: Every day | ORAL | Status: DC

## 2013-09-20 NOTE — Progress Notes (Signed)
Patient ID: Ricardo [name], male   DOB: September 15, 1934, 79 y.o.   MRN: [id number]     [facility]  No Known Allergies   Chief Complaint  Patient presents with  . Acute Visit    follow up lab results     HPI:  His thyroid lab has slightly improved; but will require further adjustment. He is not voicing any complaints and per the nursing staff he is taking his medications at this time. There are no concerns being voiced by the nursing staff at this time.    Past Medical History  Diagnosis Date  . Subdural hematoma, post-traumatic   . Chronic indwelling Foley catheter   . Dementia with aggressive behavior   . Frequent falls   . H/O alcohol abuse   . DM (diabetes mellitus)   . HTN (hypertension)   . Sinus tachycardia   . Seizures   . GERD (gastroesophageal reflux disease)     No past surgical history on file.  VITAL SIGNS BP 127/66  Pulse 64  Ht 5\' 7"  (1.702 m)  Wt 146 lb 12.8 oz (66.588 kg)  BMI 22.99 kg/m2   Patient's Medications  New Prescriptions   No medications on file  Previous Medications   ACETAMINOPHEN (TYLENOL) 325 MG TABLET    650 mg by PEG Tube route every 8 (eight) hours as needed. For fever/pain   ALBUTEROL (PROVENTIL) (2.5 MG/3ML) 0.083% NEBULIZER SOLUTION    Take 2.5 mg by nebulization every 4 (four) hours as needed. For shortness of breath   AMBULATORY NON FORMULARY MEDICATION    Medication Name: Lorazepam 0.5 mg/ml gel NWG:NFAOZ 1ml topically to skin twice daily routinely for agitation; Apply 1ml topically to skin every 6 hours as needed for agitation   CARVEDILOL (COREG) 12.5 MG TABLET    12.5 mg by PEG Tube route 2 (two) times daily with a meal.   FAMOTIDINE (PEPCID) 20 MG TABLET    Take 1 tablet (20 mg total) by mouth daily.   FOOD THICKENER (THICK IT) POWD    Take 1 Container by mouth as needed. Nectar thick   LEVETIRACETAM (KEPPRA) 500 MG TABLET    Take 500 mg by mouth 2 (two) times daily.    LEVOTHYROXINE (SYNTHROID, LEVOTHROID) 75 MCG TABLET     Take 100 mcg by mouth daily before breakfast.    LORAZEPAM (ATIVAN) 2 MG/ML INJECTION    Inject 0.14ml intramuscularly as needed for agitation; May repeat 0.34ml intramuscularly in 30 minutes if agitation continues; Inject 0.33ml intramuscularly every shift if resident is combative or violent   RIVASTIGMINE (EXELON) 9.5 MG/24HR    Place 9.5 mg onto the skin daily.   SERTRALINE (ZOLOFT) 25 MG TABLET    Take 75 mg by mouth daily.    THIAMINE (VITAMIN B-1) 100 MG TABLET    Take 1 tablet (100 mg total) by mouth daily.  Modified Medications   No medications on file  Discontinued Medications   No medications on file    SIGNIFICANT DIAGNOSTIC EXAMS  LABS REVIEWED  11-17-12: wbc 8.4; hgb 11.1; hct 33.0; mcv 88.5 ;plt 147; glucose 98; bun 20; creat 1.02; k+3.6 Na++ 142; liver normal albumin 3.7; hgb a1c 6.0 12-13-12: wbc 7.9; hgb 10.1; hct 28.7; mcv 87.5; plt 149; glucose 88; bun 30; creat 0.98; k+3.7;  Na++ 138; liver normal albumin 3.0 01-06-13: tsh 4.815 01-07-13; urine culture: yeast 03-25-13: liver normal albumin 3.7  04-21-13: tsh 3.461 05-11-13: wbc 7.5; hgb 11.4; hct 36.2; mcv 96.3; plt 156  05-17-13: tsh 3.223 05-31-13: glucose 125; bun 27; creat 0.9; k+4.1; na++ 141; liver normal albumin 4.1; depakote 32.5  06-02-13: wbc 6.4; hgb 12.0; hct 35.8; mcv 90.9; plt 120 06-16-13: liver normal albumin 3.9; depakote 62.1  07-27-13: tsh 8.701  09-13-13: tsh 7.728     Review of Systems  Constitutional: Negative for malaise/fatigue.  Respiratory: Negative for cough and shortness of breath.   Cardiovascular: Negative for chest pain and palpitations.  Gastrointestinal: Negative for heartburn, abdominal pain and constipation.  Musculoskeletal: Negative for myalgias and joint pain.  Skin: Negative.   Neurological: Negative for headaches.  Psychiatric/Behavioral: Negative for depression. The patient does not have insomnia.      Physical Exam  Constitutional: He appears well-developed and  well-nourished.  Neck: Neck supple. No tracheal deviation present. No thyromegaly present.  Cardiovascular: Normal rate, regular rhythm and intact distal pulses.   Respiratory: Effort normal and breath sounds normal. No respiratory distress. He has no wheezes.  GI: Soft. Bowel sounds are normal. He exhibits no distension. There is no tenderness.  Musculoskeletal: Normal range of motion. He exhibits no edema.  Neurological: He is alert.  Skin: Skin is warm and dry.  Psychiatric: He has a normal mood and affect.      ASSESSMENT/ PLAN:  1. Hypothyroidism: for his elevated tsh will increase his synthroid to 137 mcg daily and will check tsh; free t3; free t4; in 6 weeks. Will monitor his status.         Synthia Innocenteborah Green NP [facility]  Contact [phone] Monday through Friday 8am- 5pm  After hours call [phone]

## 2013-10-13 ENCOUNTER — Non-Acute Institutional Stay (SKILLED_NURSING_FACILITY): Payer: Medicare Other | Admitting: Adult Health

## 2013-10-13 DIAGNOSIS — F0150 Vascular dementia without behavioral disturbance: Secondary | ICD-10-CM

## 2013-10-13 DIAGNOSIS — F0280 Dementia in other diseases classified elsewhere without behavioral disturbance: Secondary | ICD-10-CM

## 2013-10-13 DIAGNOSIS — F0281 Dementia in other diseases classified elsewhere with behavioral disturbance: Secondary | ICD-10-CM

## 2013-10-13 DIAGNOSIS — G309 Alzheimer's disease, unspecified: Secondary | ICD-10-CM

## 2013-10-13 DIAGNOSIS — E119 Type 2 diabetes mellitus without complications: Secondary | ICD-10-CM

## 2013-10-13 DIAGNOSIS — R1310 Dysphagia, unspecified: Secondary | ICD-10-CM

## 2013-10-13 DIAGNOSIS — R569 Unspecified convulsions: Secondary | ICD-10-CM

## 2013-10-13 DIAGNOSIS — F02818 Dementia in other diseases classified elsewhere, unspecified severity, with other behavioral disturbance: Secondary | ICD-10-CM

## 2013-10-13 DIAGNOSIS — F323 Major depressive disorder, single episode, severe with psychotic features: Secondary | ICD-10-CM

## 2013-10-13 DIAGNOSIS — K5900 Constipation, unspecified: Secondary | ICD-10-CM

## 2013-10-13 DIAGNOSIS — I10 Essential (primary) hypertension: Secondary | ICD-10-CM

## 2013-10-13 DIAGNOSIS — F0151 Vascular dementia with behavioral disturbance: Secondary | ICD-10-CM

## 2013-10-13 DIAGNOSIS — N400 Enlarged prostate without lower urinary tract symptoms: Secondary | ICD-10-CM

## 2013-10-13 DIAGNOSIS — F01518 Vascular dementia, unspecified severity, with other behavioral disturbance: Secondary | ICD-10-CM

## 2013-10-13 DIAGNOSIS — F32A Depression, unspecified: Secondary | ICD-10-CM

## 2013-10-13 DIAGNOSIS — K219 Gastro-esophageal reflux disease without esophagitis: Secondary | ICD-10-CM

## 2013-10-14 LAB — PSA: PSA: 10.83

## 2013-10-15 ENCOUNTER — Non-Acute Institutional Stay (SKILLED_NURSING_FACILITY): Payer: Medicare Other | Admitting: Adult Health

## 2013-10-15 DIAGNOSIS — R972 Elevated prostate specific antigen [PSA]: Secondary | ICD-10-CM

## 2013-10-15 DIAGNOSIS — R339 Retention of urine, unspecified: Secondary | ICD-10-CM

## 2013-10-15 DIAGNOSIS — N400 Enlarged prostate without lower urinary tract symptoms: Secondary | ICD-10-CM

## 2013-10-17 ENCOUNTER — Encounter: Payer: Self-pay | Admitting: Adult Health

## 2013-10-17 NOTE — Progress Notes (Signed)
Patient ID: Ricardo [name], male   DOB: [birth year], [age] y.o.   MRN: [id number]     [facility]   Chief Complaint  Patient presents with  . Annual Exam    HPI:  He is being seen for his annual exam. This past year he has had his peg tube removed. Overall his status has not changed in the recent months. He has had to have his flomax stopped as this medication cannot be crushed; he is having increased difficulty voiding.    Past Medical History  Diagnosis Date  . Subdural hematoma, post-traumatic   . Chronic indwelling Foley catheter   . Dementia with aggressive behavior   . Frequent falls   . H/O alcohol abuse   . DM (diabetes mellitus)   . HTN (hypertension)   . Sinus tachycardia   . Seizures   . GERD (gastroesophageal reflux disease)     No past surgical history on file.  VITAL SIGNS BP 126/72  Pulse 60  Ht 5\' 7"  (1.702 m)  Wt 138 lb 12.8 oz (62.959 kg)  BMI 21.73 kg/m2   Patient's Medications  New Prescriptions   No medications on file  Previous Medications   ACETAMINOPHEN (TYLENOL) 325 MG TABLET    650 mg by PEG Tube route every 8 (eight) hours as needed. For fever/pain   ALBUTEROL (PROVENTIL) (2.5 MG/3ML) 0.083% NEBULIZER SOLUTION    Take 2.5 mg by nebulization every 4 (four) hours as needed. For shortness of breath   AMBULATORY NON FORMULARY MEDICATION    Medication Name: Lorazepam 0.5 mg/ml gel NWG:NFAOZSig:Apply 1ml topically to skin twice daily routinely for agitation; Apply 1ml topically to skin every 6 hours as needed for agitation   CARVEDILOL (COREG) 12.5 MG TABLET    12.5 mg by PEG Tube route 2 (two) times daily with a meal.   FAMOTIDINE (PEPCID) 20 MG TABLET    Take 1 tablet (20 mg total) by mouth daily.   FOOD THICKENER (THICK IT) POWD    Take 1 Container by mouth as needed. Nectar thick   LEVETIRACETAM (KEPPRA) 500 MG TABLET    Take 500 mg by mouth 2 (two) times daily.    LEVOTHYROXINE (SYNTHROID, LEVOTHROID) 137 MCG TABLET    Take 1 tablet (137 mcg total) by  mouth daily before breakfast.   LORAZEPAM (ATIVAN) 2 MG/ML INJECTION    Inject 0.325ml intramuscularly as needed for agitation; May repeat 0.505ml intramuscularly in 30 minutes if agitation continues; Inject 0.305ml intramuscularly every shift if resident is combative or violent   RIVASTIGMINE (EXELON) 9.5 MG/24HR    Place 9.5 mg onto the skin daily.   SERTRALINE (ZOLOFT) 25 MG TABLET    Take 75 mg by mouth daily.    THIAMINE (VITAMIN B-1) 100 MG TABLET    Take 1 tablet (100 mg total) by mouth daily.  Modified Medications   No medications on file  Discontinued Medications   No medications on file    SIGNIFICANT DIAGNOSTIC EXAMS  LABS REVIEWED  11-17-12: wbc 8.4; hgb 11.1; hct 33.0; mcv 88.5 ;plt 147; glucose 98; bun 20; creat 1.02; k+3.6 Na++ 142; liver normal albumin 3.7; hgb a1c 6.0 12-13-12: wbc 7.9; hgb 10.1; hct 28.7; mcv 87.5; plt 149; glucose 88; bun 30; creat 0.98; k+3.7;  Na++ 138; liver normal albumin 3.0 01-06-13: tsh 4.815 01-07-13; urine culture: yeast 03-25-13: liver normal albumin 3.7  04-21-13: tsh 3.461 05-11-13: wbc 7.5; hgb 11.4; hct 36.2; mcv 96.3; plt 156  05-17-13: tsh 3.223 05-31-13: glucose  125; bun 27; creat 0.9; k+4.1; na++ 141; liver normal albumin 4.1; depakote 32.5  06-02-13: wbc 6.4; hgb 12.0; hct 35.8; mcv 90.9; plt 120 06-16-13: liver normal albumin 3.9; depakote 62.1  07-27-13: tsh 8.701  09-13-13: tsh 7.728     Review of Systems  Constitutional: Negative for malaise/fatigue.  Respiratory: Negative for cough and shortness of breath.   Cardiovascular: Negative for chest pain and palpitations.  Gastrointestinal: Negative for heartburn, abdominal pain and constipation.  Musculoskeletal: Negative for myalgias and joint pain.  Skin: Negative.   Neurological: Negative for headaches.  Psychiatric/Behavioral: Negative for depression. The patient does not have insomnia.      Physical Exam  Constitutional: He appears well-developed and well-nourished.  Neck: Neck  supple. No tracheal deviation present. No thyromegaly present.  Cardiovascular: Normal rate, regular rhythm and intact distal pulses.   Respiratory: Effort normal and breath sounds normal. No respiratory distress. He has no wheezes.  GI: Soft. Bowel sounds are normal. He exhibits no distension. There is no tenderness.  Musculoskeletal: Normal range of motion. He exhibits no edema.  Neurological: He is alert.  Skin: Skin is warm and dry.  Psychiatric: He has a normal mood and affect.       ASSESSMENT/ PLAN:   1. Dysphagia: no signs of aspiration present; will continue nectar thick liquids and will continue to monitor his status  2. Seizures: no reports of seizure activity present; will continue keppra 50 mg twice daliy and will monitor his status   3. Bph: is off medication; is having some increased difficulty with voiding will check psa will monitor   4. Hypothyroidism: is without change will continue synthroid 137 mcg daily  5. Dementia: is without change in status; will continue exelon patch 9.5 mg daily and will monitor  6. Hypertension: is stable will continue coreg 12.5 mg twice daily and will monitor  7. Genella Rife: is stable will lower his pepcid to one time daily and will monitor  8. Diabetes: is stable is presently off medications will not make changes will monitor his status.   9. Depressive type psychosis: is without change; will continue zoloft 75 mg daily will continue  the ativan gel  0.5 mg twice daily and every 6 hours as needed and will monitor his status           Synthia Innocent NP [facility]  Contact [phone] Monday through Friday 8am- 5pm  After hours call [phone]

## 2013-10-18 DIAGNOSIS — R972 Elevated prostate specific antigen [PSA]: Secondary | ICD-10-CM | POA: Insufficient documentation

## 2013-10-18 NOTE — Progress Notes (Signed)
Patient ID: Ricardo BrickGeorge [name], male   DOB: 1935/04/19, 79 y.o.   MRN: [id number]     [facility]  No Known Allergies   Chief Complaint  Patient presents with  . Acute Visit    follow up lab work     HPI:  His psa is elevated at 10.830. He is also having difficulty voiding at times which is causing him pain at times. He is unable to take his flomax as this medication cannot be crushed. He requires his medications to be crushed.    Past Medical History  Diagnosis Date  . Subdural hematoma, post-traumatic   . Chronic indwelling Foley catheter   . Dementia with aggressive behavior   . Frequent falls   . H/O alcohol abuse   . DM (diabetes mellitus)   . HTN (hypertension)   . Sinus tachycardia   . Seizures   . GERD (gastroesophageal reflux disease)     No past surgical history on file.  VITAL SIGNS BP 122/65  Pulse 66  Ht 5\' 7"  (1.702 m)  Wt 138 lb 12.8 oz (62.959 kg)  BMI 21.73 kg/m2   Patient's Medications  New Prescriptions   No medications on file  Previous Medications   ACETAMINOPHEN (TYLENOL) 325 MG TABLET    650 mg by PEG Tube route every 8 (eight) hours as needed. For fever/pain   ALBUTEROL (PROVENTIL) (2.5 MG/3ML) 0.083% NEBULIZER SOLUTION    Take 2.5 mg by nebulization every 4 (four) hours as needed. For shortness of breath   AMBULATORY NON FORMULARY MEDICATION    Medication Name: Lorazepam 0.5 mg/ml gel AOZ:HYQMVSig:Apply 1ml topically to skin twice daily routinely for agitation; Apply 1ml topically to skin every 6 hours as needed for agitation   CARVEDILOL (COREG) 12.5 MG TABLET    12.5 mg by PEG Tube route 2 (two) times daily with a meal.   FAMOTIDINE (PEPCID) 20 MG TABLET    Take 1 tablet (20 mg total) by mouth daily.   FOOD THICKENER (THICK IT) POWD    Take 1 Container by mouth as needed. Nectar thick   LEVETIRACETAM (KEPPRA) 500 MG TABLET    Take 500 mg by mouth 2 (two) times daily.    LEVOTHYROXINE (SYNTHROID, LEVOTHROID) 137 MCG TABLET    Take 1 tablet (137 mcg  total) by mouth daily before breakfast.   LORAZEPAM (ATIVAN) 2 MG/ML INJECTION    Inject 0.195ml intramuscularly as needed for agitation; May repeat 0.205ml intramuscularly in 30 minutes if agitation continues; Inject 0.475ml intramuscularly every shift if resident is combative or violent   RIVASTIGMINE (EXELON) 9.5 MG/24HR    Place 9.5 mg onto the skin daily.   SERTRALINE (ZOLOFT) 25 MG TABLET    Take 75 mg by mouth daily.    THIAMINE (VITAMIN B-1) 100 MG TABLET    Take 1 tablet (100 mg total) by mouth daily.  Modified Medications   No medications on file  Discontinued Medications   No medications on file    SIGNIFICANT DIAGNOSTIC EXAMS  LABS REVIEWED  11-17-12: wbc 8.4; hgb 11.1; hct 33.0; mcv 88.5 ;plt 147; glucose 98; bun 20; creat 1.02; k+3.6 Na++ 142; liver normal albumin 3.7; hgb a1c 6.0 12-13-12: wbc 7.9; hgb 10.1; hct 28.7; mcv 87.5; plt 149; glucose 88; bun 30; creat 0.98; k+3.7;  Na++ 138; liver normal albumin 3.0 01-06-13: tsh 4.815 01-07-13; urine culture: yeast 03-25-13: liver normal albumin 3.7  04-21-13: tsh 3.461 05-11-13: wbc 7.5; hgb 11.4; hct 36.2; mcv 96.3; plt 156  05-17-13: tsh 3.223 05-31-13: glucose 125; bun 27; creat 0.9; k+4.1; na++ 141; liver normal albumin 4.1; depakote 32.5  06-02-13: wbc 6.4; hgb 12.0; hct 35.8; mcv 90.9; plt 120 06-16-13: liver normal albumin 3.9; depakote 62.1  07-27-13: tsh 8.701  09-13-13: tsh 7.728  10-14-13; psa: 10.830     Review of Systems  Constitutional: Negative for malaise/fatigue.  Respiratory: Negative for cough and shortness of breath.   Cardiovascular: Negative for chest pain and palpitations.  Gastrointestinal: Negative for heartburn, abdominal pain and constipation.  Musculoskeletal: Negative for myalgias and joint pain.  Skin: Negative.   Neurological: Negative for headaches.  Psychiatric/Behavioral: Negative for depression. The patient does not have insomnia.      Physical Exam  Constitutional: He appears well-developed  and well-nourished.  Neck: Neck supple. No tracheal deviation present. No thyromegaly present.  Cardiovascular: Normal rate, regular rhythm and intact distal pulses.   Respiratory: Effort normal and breath sounds normal. No respiratory distress. He has no wheezes.  GI: Soft. Bowel sounds are normal. He exhibits no distension. There is no tenderness.  Musculoskeletal: Normal range of motion. He exhibits no edema.  Neurological: He is alert.  Skin: Skin is warm and dry.  Psychiatric: He has a normal mood and affect.      ASSESSMENT/ PLAN:  1. BPH: with an elevated psa of 10.830. Will have him seen by urology and will continue to monitor his status. He is unable to take flomax as this medication cannot be crushed.

## 2013-10-29 LAB — T4, FREE: Free T4: 0.77

## 2013-10-29 LAB — T3, FREE: T3, Free: 2.25

## 2013-10-29 LAB — TSH: TSH: 7.73 u[IU]/mL — AB (ref 0.41–5.90)

## 2013-11-12 ENCOUNTER — Non-Acute Institutional Stay (SKILLED_NURSING_FACILITY): Payer: Medicare Other | Admitting: Adult Health

## 2013-11-12 DIAGNOSIS — K219 Gastro-esophageal reflux disease without esophagitis: Secondary | ICD-10-CM

## 2013-11-12 DIAGNOSIS — F0280 Dementia in other diseases classified elsewhere without behavioral disturbance: Secondary | ICD-10-CM

## 2013-11-12 DIAGNOSIS — F0150 Vascular dementia without behavioral disturbance: Secondary | ICD-10-CM

## 2013-11-12 DIAGNOSIS — F0151 Vascular dementia with behavioral disturbance: Secondary | ICD-10-CM

## 2013-11-12 DIAGNOSIS — R1310 Dysphagia, unspecified: Secondary | ICD-10-CM

## 2013-11-12 DIAGNOSIS — N400 Enlarged prostate without lower urinary tract symptoms: Secondary | ICD-10-CM

## 2013-11-12 DIAGNOSIS — E039 Hypothyroidism, unspecified: Secondary | ICD-10-CM

## 2013-11-12 DIAGNOSIS — I10 Essential (primary) hypertension: Secondary | ICD-10-CM

## 2013-11-12 DIAGNOSIS — G309 Alzheimer's disease, unspecified: Secondary | ICD-10-CM

## 2013-11-12 DIAGNOSIS — R972 Elevated prostate specific antigen [PSA]: Secondary | ICD-10-CM

## 2013-11-12 DIAGNOSIS — F0281 Dementia in other diseases classified elsewhere with behavioral disturbance: Secondary | ICD-10-CM

## 2013-11-12 DIAGNOSIS — R569 Unspecified convulsions: Secondary | ICD-10-CM

## 2013-11-12 DIAGNOSIS — F02818 Dementia in other diseases classified elsewhere, unspecified severity, with other behavioral disturbance: Secondary | ICD-10-CM

## 2013-11-15 LAB — HM DIABETES FOOT EXAM: HM Diabetic Foot Exam: NEGATIVE

## 2013-11-21 ENCOUNTER — Encounter: Payer: Self-pay | Admitting: Adult Health

## 2013-11-21 MED ORDER — LEVOTHYROXINE SODIUM 150 MCG PO TABS: 150.0000 ug | ORAL_TABLET | Freq: Every day | ORAL | Status: DC

## 2013-11-21 NOTE — Progress Notes (Signed)
Patient ID: Ricardo BrickGeorge [name], male   DOB: Jul 22, 1934, 79 y.o.   MRN: [id number]     [facility]  No Known Allergies   Chief Complaint  Patient presents with  . Medical Management of Chronic Issues    HPI:  He is being seen for the management of his chronic illnesses. Overall his status is without change overall. He has been treated for an uti this past month without complication. There are no concerns being voiced by the nursing staff at this time.    Past Medical History  Diagnosis Date  . Subdural hematoma, post-traumatic   . Chronic indwelling Foley catheter   . Dementia with aggressive behavior   . Frequent falls   . H/O alcohol abuse   . DM (diabetes mellitus)   . HTN (hypertension)   . Sinus tachycardia   . Seizures   . GERD (gastroesophageal reflux disease)     No past surgical history on file.  VITAL SIGNS BP 130/70  Pulse 64  Ht 5\' 7"  (1.702 m)  Wt 141 lb (63.957 kg)  BMI 22.08 kg/m2   Patient's Medications  New Prescriptions   No medications on file  Previous Medications   ACETAMINOPHEN (TYLENOL) 325 MG TABLET    650 mg by PEG Tube route every 8 (eight) hours as needed. For fever/pain   ALBUTEROL (PROVENTIL) (2.5 MG/3ML) 0.083% NEBULIZER SOLUTION    Take 2.5 mg by nebulization every 4 (four) hours as needed. For shortness of breath   AMBULATORY NON FORMULARY MEDICATION    Medication Name: Lorazepam 0.5 mg/ml gel JXB:[id number]:Apply 1ml topically to skin twice daily routinely for agitation; Apply 1ml topically to skin every 6 hours as needed for agitation   CARVEDILOL (COREG) 12.5 MG TABLET    12.5 mg by PEG Tube route 2 (two) times daily with a meal.   FAMOTIDINE (PEPCID) 20 MG TABLET    Take 1 tablet (20 mg total) by mouth daily.   FOOD THICKENER (THICK IT) POWD    Take 1 Container by mouth as needed. Nectar thick   LEVETIRACETAM (KEPPRA) 500 MG TABLET    Take 500 mg by mouth 2 (two) times daily.    LEVOTHYROXINE (SYNTHROID, LEVOTHROID) 137 MCG TABLET    Take 1  tablet (137 mcg total) by mouth daily before breakfast.   LORAZEPAM (ATIVAN) 2 MG/ML INJECTION    Inject 0.645ml intramuscularly as needed for agitation; May repeat 0.935ml intramuscularly in 30 minutes if agitation continues; Inject 0.645ml intramuscularly every shift if resident is combative or violent   RIVASTIGMINE (EXELON) 9.5 MG/24HR    Place 9.5 mg onto the skin daily.   SERTRALINE (ZOLOFT) 25 MG TABLET    Take 75 mg by mouth daily.    THIAMINE (VITAMIN B-1) 100 MG TABLET    Take 1 tablet (100 mg total) by mouth daily.  Modified Medications   No medications on file  Discontinued Medications   No medications on file    SIGNIFICANT DIAGNOSTIC EXAMS  10-20-13: renal ultrasound: no hydronephrosis; no stones; PVR 69 cc.    LABS REVIEWED   12-13-12: wbc 7.9; hgb 10.1; hct 28.7; mcv 87.5; plt 149; glucose 88; bun 30; creat 0.98; k+3.7;  Na++ 138; liver normal albumin 3.0 01-06-13: tsh 4.815 01-07-13; urine culture: yeast 03-25-13: liver normal albumin 3.7  04-21-13: tsh 3.461 05-11-13: wbc 7.5; hgb 11.4; hct 36.2; mcv 96.3; plt 156  05-17-13: tsh 3.223 05-31-13: glucose 125; bun 27; creat 0.9; k+4.1; na++ 141; liver normal albumin 4.1; depakote  32.5  06-02-13: wbc 6.4; hgb 12.0; hct 35.8; mcv 90.9; plt 120 06-16-13: liver normal albumin 3.9; depakote 62.1  07-27-13: tsh 8.701  09-13-13: tsh 7.728  10-14-13; psa: 10.830  10-29-13: tsh 7.728; free t3: 2.250; free t4: 0.770 11-01-13: urine culture: proteus mirabilis: levaquin    Review of Systems  Constitutional: Negative for malaise/fatigue.  Respiratory: Negative for cough and shortness of breath.   Cardiovascular: Negative for chest pain and palpitations.  Gastrointestinal: Negative for heartburn, abdominal pain and constipation.  Musculoskeletal: Negative for myalgias and joint pain.  Skin: Negative.   Neurological: Negative for headaches.  Psychiatric/Behavioral: Negative for depression. The patient does not have insomnia.      Physical  Exam  Constitutional: He appears well-developed and well-nourished.  Neck: Neck supple. No tracheal deviation present. No thyromegaly present.  Cardiovascular: Normal rate, regular rhythm and intact distal pulses.   Respiratory: Effort normal and breath sounds normal. No respiratory distress. He has no wheezes.  GI: Soft. Bowel sounds are normal. He exhibits no distension. There is no tenderness.  Musculoskeletal: Normal range of motion. He exhibits no edema.  Neurological: He is alert.  Skin: Skin is warm and dry.  Psychiatric: He has a normal mood and affect.      ASSESSMENT/ PLAN:  1. Dysphagia: no signs of aspiration present; will continue nectar thick liquids and will continue to monitor his status  2. Seizures: no reports of seizure activity present; will continue keppra 50 mg twice daliy and will monitor his status   3. Bph: is off medication; is stable; has an elevated psa has been seen by urology. Will monitor   4. Hypothyroidism: will increase synthroid to 150 mcg daily and will check tsh free t3 free t4 in 6 weeks will monitor   5. Dementia: is without change in status; will continue exelon patch 9.5 mg daily and will monitor  6. Hypertension: is stable will continue coreg 12.5 mg twice daily and will monitor  7. Genella RifeGerd: is stable will lower his pepcid to one time daily and will monitor  8. Diabetes: is stable is presently off medications will not make changes will monitor his status.   9. 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

## 2013-11-24 LAB — BASIC METABOLIC PANEL
BUN: 25 mg/dL — AB (ref 4–21)
Creatinine: 1 mg/dL (ref 0.6–1.3)
GLUCOSE: 95 mg/dL
Potassium: 4 mmol/L (ref 3.4–5.3)
SODIUM: 140 mmol/L (ref 137–147)

## 2013-11-24 LAB — HEPATIC FUNCTION PANEL
ALT: 8 U/L — AB (ref 10–40)
AST: 15 U/L (ref 14–40)
Alkaline Phosphatase: 67 U/L (ref 25–125)
BILIRUBIN, TOTAL: 0.3 mg/dL

## 2013-11-24 LAB — CBC AND DIFFERENTIAL
HEMATOCRIT: 36 % — AB (ref 41–53)
HEMOGLOBIN: 11.6 g/dL — AB (ref 13.5–17.5)
Platelets: 120 10*3/uL — AB (ref 150–399)
WBC: 7.2 10*3/mL

## 2013-11-24 LAB — HEMOGLOBIN A1C: Hgb A1c MFr Bld: 5.9 % (ref 4.0–6.0)

## 2013-12-10 ENCOUNTER — Non-Acute Institutional Stay (SKILLED_NURSING_FACILITY): Payer: Medicare Other | Admitting: Adult Health

## 2013-12-10 DIAGNOSIS — E119 Type 2 diabetes mellitus without complications: Secondary | ICD-10-CM

## 2013-12-10 DIAGNOSIS — I10 Essential (primary) hypertension: Secondary | ICD-10-CM

## 2013-12-10 DIAGNOSIS — K219 Gastro-esophageal reflux disease without esophagitis: Secondary | ICD-10-CM

## 2013-12-10 DIAGNOSIS — F02818 Dementia in other diseases classified elsewhere, unspecified severity, with other behavioral disturbance: Secondary | ICD-10-CM

## 2013-12-10 DIAGNOSIS — F0151 Vascular dementia with behavioral disturbance: Secondary | ICD-10-CM

## 2013-12-10 DIAGNOSIS — F0280 Dementia in other diseases classified elsewhere without behavioral disturbance: Secondary | ICD-10-CM

## 2013-12-10 DIAGNOSIS — R569 Unspecified convulsions: Secondary | ICD-10-CM

## 2013-12-10 DIAGNOSIS — F01518 Vascular dementia, unspecified severity, with other behavioral disturbance: Secondary | ICD-10-CM

## 2013-12-10 DIAGNOSIS — F323 Major depressive disorder, single episode, severe with psychotic features: Secondary | ICD-10-CM

## 2013-12-10 DIAGNOSIS — F0281 Dementia in other diseases classified elsewhere with behavioral disturbance: Secondary | ICD-10-CM

## 2013-12-10 DIAGNOSIS — F32A Depression, unspecified: Secondary | ICD-10-CM

## 2013-12-10 DIAGNOSIS — G309 Alzheimer's disease, unspecified: Secondary | ICD-10-CM

## 2013-12-10 DIAGNOSIS — R1310 Dysphagia, unspecified: Secondary | ICD-10-CM

## 2013-12-10 DIAGNOSIS — F0150 Vascular dementia without behavioral disturbance: Secondary | ICD-10-CM

## 2013-12-20 ENCOUNTER — Other Ambulatory Visit: Payer: Self-pay | Admitting: *Deleted

## 2013-12-20 MED ORDER — LORAZEPAM 2 MG/ML IJ SOLN: INTRAMUSCULAR | Status: AC

## 2013-12-20 NOTE — Telephone Encounter (Signed)
Ricardo [name]

## 2013-12-24 LAB — TSH: TSH: 8.27 u[IU]/mL — AB (ref 0.41–5.90)

## 2013-12-28 ENCOUNTER — Non-Acute Institutional Stay (SKILLED_NURSING_FACILITY): Payer: Medicare Other | Admitting: Adult Health

## 2013-12-28 DIAGNOSIS — E038 Other specified hypothyroidism: Secondary | ICD-10-CM

## 2013-12-29 ENCOUNTER — Encounter: Payer: Self-pay | Admitting: Adult Health

## 2013-12-29 NOTE — Progress Notes (Signed)
Patient ID: Ricardo BrickGeorge [name], male   DOB: 1935/06/01, 79 y.o.   MRN: [id number]     Ricardo [name]  No Known Allergies   Chief Complaint  Patient presents with  . Medical Management of Chronic Issues    HPI:  He is being seen for the management of his chronic illnesses. Overall his status is stable. There are no concerns being voiced by the nursing staff at this time. He has had no further issues with uti's. He is not voicing any complaints at this time.    Past Medical History  Diagnosis Date  . Subdural hematoma, post-traumatic   . Chronic indwelling Foley catheter   . Dementia with aggressive behavior   . Frequent falls   . H/O alcohol abuse   . DM (diabetes mellitus)   . HTN (hypertension)   . Sinus tachycardia   . Seizures   . GERD (gastroesophageal reflux disease)     No past surgical history on file.  VITAL SIGNS BP 107/68  Pulse 69  Ht 5\' 7"  (1.702 m)  Wt 141 lb (63.957 kg)  BMI 22.08 kg/m2   Patient's Medications  New Prescriptions   No medications on file  Previous Medications   ACETAMINOPHEN (TYLENOL) 325 MG TABLET    650 mg by PEG Tube route every 8 (eight) hours as needed. For fever/pain   ALBUTEROL (PROVENTIL) (2.5 MG/3ML) 0.083% NEBULIZER SOLUTION    Take 2.5 mg by nebulization every 4 (four) hours as needed. For shortness of breath   AMBULATORY NON FORMULARY MEDICATION    Medication Name: Lorazepam 0.5 mg/ml gel WUJ:[id number]:Apply 1ml topically to skin twice daily routinely for agitation; Apply 1ml topically to skin every 6 hours as needed for agitation   CARVEDILOL (COREG) 12.5 MG TABLET    12.5 mg by PEG Tube route 2 (two) times daily with a meal.   FAMOTIDINE (PEPCID) 20 MG TABLET    Take 1 tablet (20 mg total) by mouth daily.   FOOD THICKENER (THICK IT) POWD    Take 1 Container by mouth as needed. Nectar thick   LEVETIRACETAM (KEPPRA) 500 MG TABLET    Take 500 mg by mouth 2 (two) times daily.    LEVOTHYROXINE (SYNTHROID, LEVOTHROID) 150 MCG TABLET    Take 1  tablet (150 mcg total) by mouth daily.   LORAZEPAM (ATIVAN) 2 MG/ML INJECTION    Inject 0.715ml intramuscularly as needed for agitation; May repeat 0.255ml intramuscularly in 30 minutes if agitation continues; Inject 0.365ml intramuscularly every shift if resident is combative or violent   seroquel 150 mg  Take twice daily    RIVASTIGMINE (EXELON) 9.5 MG/24HR    [name] 9.5 mg onto the skin daily.   SERTRALINE (ZOLOFT) 25 MG TABLET    Take 75 mg by mouth daily.    THIAMINE (VITAMIN B-1) 100 MG TABLET    Take 1 tablet (100 mg total) by mouth daily.  Modified Medications   No medications on file  Discontinued Medications   No medications on file    SIGNIFICANT DIAGNOSTIC EXAMS  10-20-13: renal ultrasound: no hydronephrosis; no stones; PVR 69 cc.    LABS REVIEWED   01-06-13: tsh 4.815 01-07-13; urine culture: yeast 03-25-13: liver normal albumin 3.7  04-21-13: tsh 3.461 05-11-13: wbc 7.5; hgb 11.4; hct 36.2; mcv 96.3; plt 156  05-17-13: tsh 3.223 05-31-13: glucose 125; bun 27; creat 0.9; k+4.1; na++ 141; liver normal albumin 4.1; depakote 32.5  06-02-13: wbc 6.4; hgb 12.0; hct 35.8; mcv 90.9; plt 120 06-16-13:  liver normal albumin 3.9; depakote 62.1  07-27-13: tsh 8.701  09-13-13: tsh 7.728  10-14-13; psa: 10.830  10-29-13: tsh 7.728; free t3: 2.250; free t4: 0.770 11-01-13: urine culture: proteus mirabilis: levaquin    Review of Systems  Constitutional: Negative for malaise/fatigue.  Respiratory: Negative for cough and shortness of breath.   Cardiovascular: Negative for chest pain and palpitations.  Gastrointestinal: Negative for heartburn, abdominal pain and constipation.  Musculoskeletal: Negative for myalgias and joint pain.  Skin: Negative.   Neurological: Negative for headaches.  Psychiatric/Behavioral: Negative for depression. The patient does not have insomnia.      Physical Exam  Constitutional: He appears well-developed and well-nourished.  Neck: Neck supple. No tracheal deviation  present. No thyromegaly present.  Cardiovascular: Normal rate, regular rhythm and intact distal pulses.   Respiratory: Effort normal and breath sounds normal. No respiratory distress. He has no wheezes.  GI: Soft. Bowel sounds are normal. He exhibits no distension. There is no tenderness.  Musculoskeletal: Normal range of motion. He exhibits no edema.  Neurological: He is alert.  Skin: Skin is warm and dry.  Psychiatric: He has a normal mood and affect.      ASSESSMENT/ PLAN:  1. Dysphagia: no signs of aspiration present; will continue nectar thick liquids and will continue to monitor his status  2. Seizures: no reports of seizure activity present; will continue keppra 50 mg twice daliy and will monitor his status   3. Bph: is off medication; is stable; has an elevated psa has been seen by urology. Will monitor   4. Hypothyroidism: will continue synthroid 150 mcg daily his thyroids are due int he next couple of weeks  5. Dementia: is without change in status; will continue exelon patch 9.5 mg daily and will monitor  6. Hypertension: is stable will continue coreg 12.5 mg twice daily and will monitor  7. Genella RifeGerd: is stable will lower his pepcid to one time daily and will monitor  8. Diabetes: is stable is presently off medications will not make changes will monitor his status.   9. 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  Contact [phone] Monday through Friday 8am- 5pm  After hours call [phone]

## 2014-01-05 MED ORDER — LEVOTHYROXINE SODIUM 175 MCG PO TABS: 175.0000 ug | ORAL_TABLET | Freq: Every day | ORAL | Status: AC

## 2014-01-05 NOTE — Progress Notes (Signed)
Patient ID: Ricardo [name], male   DOB: December 26, 1934, 79 y.o.   MRN: [id number]     [facility]  No Known Allergies   Chief Complaint  Patient presents with  . Acute Visit    follow up thyroid labs     HPI:  No problem-specific assessment & plan notes found for this encounter.   Past Medical History  Diagnosis Date  . Subdural hematoma, post-traumatic   . Chronic indwelling Foley catheter   . Dementia with aggressive behavior   . Frequent falls   . H/O alcohol abuse   . DM (diabetes mellitus)   . HTN (hypertension)   . Sinus tachycardia   . Seizures   . GERD (gastroesophageal reflux disease)     No past surgical history on file.  VITAL SIGNS BP 119/88  Pulse 80  Ht 5\' 7"  (1.702 m)  Wt 141 lb (63.957 kg)  BMI 22.08 kg/m2   Patient's Medications  New Prescriptions   No medications on file  Previous Medications   ACETAMINOPHEN (TYLENOL) 325 MG TABLET    650 mg by PEG Tube route every 8 (eight) hours as needed. For fever/pain   ALBUTEROL (PROVENTIL) (2.5 MG/3ML) 0.083% NEBULIZER SOLUTION    Take 2.5 mg by nebulization every 4 (four) hours as needed. For shortness of breath   AMBULATORY NON FORMULARY MEDICATION    Medication Name: Lorazepam 0.5 mg/ml gel WUJ:WJXBJSig:Apply 1ml topically to skin twice daily routinely for agitation; Apply 1ml topically to skin every 6 hours as needed for agitation   CARVEDILOL (COREG) 12.5 MG TABLET    12.5 mg by PEG Tube route 2 (two) times daily with a meal.   FAMOTIDINE (PEPCID) 20 MG TABLET    Take 1 tablet (20 mg total) by mouth daily.   FOOD THICKENER (THICK IT) POWD    Take 1 Container by mouth as needed. Nectar thick   LEVETIRACETAM (KEPPRA) 500 MG TABLET    Take 500 mg by mouth 2 (two) times daily.    LEVOTHYROXINE (SYNTHROID, LEVOTHROID) 150 MCG TABLET    Take 1 tablet (150 mcg total) by mouth daily.   LORAZEPAM (ATIVAN) 2 MG/ML INJECTION    Inject 0.375ml intramuscularly as needed for agitation; May repeat 0.345ml intramuscularly in 30  minutes if agitation continues; Inject 0.375ml intramuscularly every shift if resident is combative or violent   QUETIAPINE (SEROQUEL) 50 MG TABLET    Take 150 mg by mouth 2 (two) times daily.   RIVASTIGMINE (EXELON) 9.5 MG/24HR    Place 9.5 mg onto the skin daily.   SERTRALINE (ZOLOFT) 25 MG TABLET    Take 75 mg by mouth daily.    THIAMINE (VITAMIN B-1) 100 MG TABLET    Take 1 tablet (100 mg total) by mouth daily.  Modified Medications   No medications on file  Discontinued Medications   No medications on file    SIGNIFICANT DIAGNOSTIC EXAMS  10-20-13: renal ultrasound: no hydronephrosis; no stones; PVR 69 cc.    LABS REVIEWED   01-06-13: tsh 4.815 01-07-13; urine culture: yeast 03-25-13: liver normal albumin 3.7  04-21-13: tsh 3.461 05-11-13: wbc 7.5; hgb 11.4; hct 36.2; mcv 96.3; plt 156  05-17-13: tsh 3.223 05-31-13: glucose 125; bun 27; creat 0.9; k+4.1; na++ 141; liver normal albumin 4.1; depakote 32.5  06-02-13: wbc 6.4; hgb 12.0; hct 35.8; mcv 90.9; plt 120 06-16-13: liver normal albumin 3.9; depakote 62.1  07-27-13: tsh 8.701  09-13-13: tsh 7.728  10-14-13; psa: 10.830  10-29-13: tsh 7.728; free  t3: 2.250; free t4: 0.770 11-01-13: urine culture: proteus mirabilis: levaquin 12-24-13: tsh 8.267    Review of Systems  Constitutional: Negative for malaise/fatigue.  Respiratory: Negative for cough and shortness of breath.   Cardiovascular: Negative for chest pain and palpitations.  Gastrointestinal: Negative for heartburn, abdominal pain and constipation.  Musculoskeletal: Negative for myalgias and joint pain.  Skin: Negative.   Neurological: Negative for headaches.  Psychiatric/Behavioral: Negative for depression. The patient does not have insomnia.      Physical Exam  Constitutional: He appears well-developed and well-nourished.  Neck: Neck supple. No tracheal deviation present. No thyromegaly present.  Cardiovascular: Normal rate, regular rhythm and intact distal pulses.     Respiratory: Effort normal and breath sounds normal. No respiratory distress. He has no wheezes.  GI: Soft. Bowel sounds are normal. He exhibits no distension. There is no tenderness.  Musculoskeletal: Normal range of motion. He exhibits no edema.  Neurological: He is alert.  Skin: Skin is warm and dry.  Psychiatric: He has a normal mood and affect.       ASSESSMENT/ PLAN:  1. Hypothyroidism: will increase his synthroid to 175 mcg daily; will check tsh free t3 free t4 in 6 weeks will monitor his status     Synthia Innocenteborah Shyne Lehrke NP [facility]  Contact [phone] Monday through Friday 8am- 5pm  After hours call [phone]

## 2014-01-18 ENCOUNTER — Other Ambulatory Visit: Payer: Self-pay | Admitting: *Deleted

## 2014-01-18 MED ORDER — LORAZEPAM 0.5 MG PO TABS: ORAL_TABLET | ORAL | Status: DC

## 2014-01-18 NOTE — Telephone Encounter (Signed)
[facility] 

## 2014-01-24 ENCOUNTER — Non-Acute Institutional Stay (SKILLED_NURSING_FACILITY): Payer: PRIVATE HEALTH INSURANCE | Admitting: Internal Medicine

## 2014-01-24 ENCOUNTER — Encounter: Payer: Self-pay | Admitting: Internal Medicine

## 2014-01-24 DIAGNOSIS — F0150 Vascular dementia without behavioral disturbance: Secondary | ICD-10-CM | POA: Insufficient documentation

## 2014-01-24 DIAGNOSIS — F01518 Vascular dementia, unspecified severity, with other behavioral disturbance: Secondary | ICD-10-CM

## 2014-01-24 DIAGNOSIS — R410 Disorientation, unspecified: Secondary | ICD-10-CM

## 2014-01-24 DIAGNOSIS — F05 Delirium due to known physiological condition: Secondary | ICD-10-CM

## 2014-01-24 DIAGNOSIS — F0151 Vascular dementia with behavioral disturbance: Secondary | ICD-10-CM

## 2014-01-24 DIAGNOSIS — E039 Hypothyroidism, unspecified: Secondary | ICD-10-CM | POA: Insufficient documentation

## 2014-01-24 DIAGNOSIS — F331 Major depressive disorder, recurrent, moderate: Secondary | ICD-10-CM

## 2014-01-24 DIAGNOSIS — G40309 Generalized idiopathic epilepsy and epileptic syndromes, not intractable, without status epilepticus: Secondary | ICD-10-CM

## 2014-01-24 DIAGNOSIS — E1159 Type 2 diabetes mellitus with other circulatory complications: Secondary | ICD-10-CM

## 2014-01-24 NOTE — Progress Notes (Signed)
Patient ID: Ricardo [name], male   DOB: 06/20/1935, 79 y.o.   MRN: [id number]  Location:  [facility]  Provider:  Oneal Grout, MD  Code Status:  Full code   Chief Complaint  Patient presents with  . new transfer    new to [facility]    HPI:  79 y/o male patient is new to [facility] starting 01/05/14. He is s/p subdural hematoma, advanced dementia, depression, anxiety, htn, dm, seizure, hypothyroidism among others. He has been at his baseline recently though has frequent episodes of agitations. His weight is stable. Difficult to obtain hpi and ros from patient.   Review of Systems:  Unable to obtain  Medications: Patient's Medications  New Prescriptions   No medications on file  Previous Medications   ACETAMINOPHEN (TYLENOL) 325 MG TABLET    650 mg by PEG Tube route every 8 (eight) hours as needed. For fever/pain   ALBUTEROL (PROVENTIL) (2.5 MG/3ML) 0.083% NEBULIZER SOLUTION    Take 2.5 mg by nebulization every 4 (four) hours as needed. For shortness of breath   AMBULATORY NON FORMULARY MEDICATION    Medication Name: Lorazepam 0.5 mg/ml gel WUJ:WJXBJ 1ml topically to skin twice daily routinely for agitation; Apply 1ml topically to skin every 6 hours as needed for agitation   CARVEDILOL (COREG) 12.5 MG TABLET    12.5 mg by PEG Tube route 2 (two) times daily with a meal.   FAMOTIDINE (PEPCID) 20 MG TABLET    Take 1 tablet (20 mg total) by mouth daily.   FOOD THICKENER (THICK IT) POWD    Take 1 Container by mouth as needed. Nectar thick   LEVETIRACETAM (KEPPRA) 500 MG TABLET    Take 500 mg by mouth 2 (two) times daily. For seizures   LEVOTHYROXINE (SYNTHROID) 175 MCG TABLET    Take 1 tablet (175 mcg total) by mouth daily before breakfast.   LORAZEPAM (ATIVAN) 2 MG/ML INJECTION    Inject 0.91ml intramuscularly as needed for agitation; May repeat 0.11ml intramuscularly in 30 minutes if agitation continues; Inject 0.73ml intramuscularly every shift if resident is  combative or violent   QUETIAPINE (SEROQUEL) 300 MG TABLET    Take 300 mg by mouth at bedtime. Take 1/2 tablet = 150 mg by mouth twice daily  For mood disorder/alzheimers.   QUETIAPINE (SEROQUEL) 50 MG TABLET    Take 150 mg by mouth 2 (two) times daily.   RIVASTIGMINE (EXELON) 9.5 MG/24HR    Place 9.5 mg onto the skin daily. For dementia   SERTRALINE (ZOLOFT) 25 MG TABLET    Take 75 mg by mouth daily. For depression and anxiety   THIAMINE (VITAMIN B-1) 100 MG TABLET    Take 1 tablet (100 mg total) by mouth daily.  Modified Medications   No medications on file  Discontinued Medications   LORAZEPAM (ATIVAN) 0.5 MG TABLET    Take one tablet by mouth twice daily for anxiety   Past Medical History  Diagnosis Date  . Subdural hematoma, post-traumatic   . Chronic indwelling Foley catheter   . Dementia with aggressive behavior   . Frequent falls   . H/O alcohol abuse   . DM (diabetes mellitus)   . HTN (hypertension)   . Sinus tachycardia   . Seizures   . GERD (gastroesophageal reflux disease)    No past surgical history on file. History   Social History  . Marital Status: Single    Spouse Name: N/A    Number  of Children: N/A  . Years of Education: N/A   Occupational History  . Not on file.   Social History Main Topics  . Smoking status: Former Games developermoker  . Smokeless tobacco: Not on file  . Alcohol Use: Not on file  . Drug Use: Not on file  . Sexual Activity: Not on file   Other Topics Concern  . Not on file   Social History Narrative  . No narrative on file   No family history on file.  Physical Exam: Filed Vitals:   01/24/14 0941  BP: 106/61  Pulse: 59  Temp: 96.5 F (35.8 C)  Resp: 20  Height: 5\' 7"  (1.702 m)  Weight: 146 lb (66.225 kg)  SpO2: 99%   General- elderly male in no acute distress, has confusion at baseline Head- atraumatic, normocephalic Eyes- PERRLA, EOMI, no pallor, no icterus Neck- no lymphadenopathy, no thyromegaly, no jugular vein  distension Cardiovascular- normal s1,s2, no murmurs/ rubs/ gallops Respiratory- bilateral clear to auscultation, no wheeze, no rhonchi, no crackles Abdomen- bowel sounds present, soft, non tender Musculoskeletal- able to move all 4 extremities, on wheelchair, self propels Neurological- no new focal deficit Psychiatry- alert and calm this visit  Labs reviewed: Basic Metabolic Panel:  Recent Labs  16/04/9604/20/15  NA 140  K 4.0  BUN 25*  CREATININE 1.0    Liver Function Tests:  Recent Labs  11/24/13  AST 15  ALT 8*  ALKPHOS 67    CBC:  Recent Labs  11/24/13  WBC 7.2  HGB 11.6*  HCT 36*  PLT 120*    Assessment/Plan  Dementia Vascular and alcohol induced. mmse 2/30 in 2014. Continue exelon patch for now  Depression Persists, calm today. Continue lorazepam and zoloft for now.   Hypothyroidism Continue levothyroxine 175 mcg daily  Seizure No recent epsiodes. Continue current regimen depakote and keppra  Dm With circulatory problem. a1c 5.9 in 5/15. Consider statin after family meeting. Off bp and dm medications  Oneal GroutMAHIMA Jaxan Michel, MD  [facility] [phone] (Monday-Friday 8 am - 5 pm) [phone] (afterhours)

## 2014-02-05 ENCOUNTER — Encounter (HOSPITAL_COMMUNITY): Payer: Self-pay | Admitting: Emergency Medicine

## 2014-02-05 ENCOUNTER — Emergency Department (HOSPITAL_COMMUNITY)
Admission: EM | Admit: 2014-02-05 | Discharge: 2014-02-05 | Disposition: A | Discharge status: Home / Self Care | Payer: Medicare Other | Attending: Emergency Medicine | Admitting: Emergency Medicine

## 2014-02-05 DIAGNOSIS — Z87891 Personal history of nicotine dependence: Secondary | ICD-10-CM | POA: Insufficient documentation

## 2014-02-05 DIAGNOSIS — G40909 Epilepsy, unspecified, not intractable, without status epilepticus: Secondary | ICD-10-CM | POA: Insufficient documentation

## 2014-02-05 DIAGNOSIS — Z87828 Personal history of other (healed) physical injury and trauma: Secondary | ICD-10-CM | POA: Insufficient documentation

## 2014-02-05 DIAGNOSIS — I10 Essential (primary) hypertension: Secondary | ICD-10-CM | POA: Diagnosis not present

## 2014-02-05 DIAGNOSIS — K219 Gastro-esophageal reflux disease without esophagitis: Secondary | ICD-10-CM | POA: Diagnosis not present

## 2014-02-05 DIAGNOSIS — R1111 Vomiting without nausea: Secondary | ICD-10-CM

## 2014-02-05 DIAGNOSIS — F0391 Unspecified dementia with behavioral disturbance: Secondary | ICD-10-CM | POA: Insufficient documentation

## 2014-02-05 DIAGNOSIS — R112 Nausea with vomiting, unspecified: Secondary | ICD-10-CM | POA: Diagnosis not present

## 2014-02-05 DIAGNOSIS — Z79899 Other long term (current) drug therapy: Secondary | ICD-10-CM | POA: Insufficient documentation

## 2014-02-05 DIAGNOSIS — E119 Type 2 diabetes mellitus without complications: Secondary | ICD-10-CM | POA: Diagnosis not present

## 2014-02-05 DIAGNOSIS — Z9181 History of falling: Secondary | ICD-10-CM | POA: Diagnosis not present

## 2014-02-05 DIAGNOSIS — F03918 Unspecified dementia, unspecified severity, with other behavioral disturbance: Secondary | ICD-10-CM | POA: Insufficient documentation

## 2014-02-05 LAB — I-STAT CHEM 8, ED
BUN: 22 mg/dL (ref 6–23)
CREATININE: 1 mg/dL (ref 0.50–1.35)
Calcium, Ion: 1.18 mmol/L (ref 1.13–1.30)
Chloride: 104 mEq/L (ref 96–112)
GLUCOSE: 138 mg/dL — AB (ref 70–99)
HCT: 38 % — ABNORMAL LOW (ref 39.0–52.0)
HEMOGLOBIN: 12.9 g/dL — AB (ref 13.0–17.0)
Potassium: 3.8 mEq/L (ref 3.7–5.3)
Sodium: 142 mEq/L (ref 137–147)
TCO2: 25 mmol/L (ref 0–100)

## 2014-02-05 LAB — BASIC METABOLIC PANEL
Anion gap: 13 (ref 5–15)
BUN: 21 mg/dL (ref 6–23)
CALCIUM: 9.1 mg/dL (ref 8.4–10.5)
CO2: 26 mEq/L (ref 19–32)
Chloride: 102 mEq/L (ref 96–112)
Creatinine, Ser: 0.89 mg/dL (ref 0.50–1.35)
GFR calc Af Amer: >90 mL/min (ref >90)
GFR, EST NON AFRICAN AMERICAN: 80 mL/min — AB (ref >90)
GLUCOSE: 135 mg/dL — AB (ref 70–99)
Potassium: 3.9 mEq/L (ref 3.7–5.3)
Sodium: 141 mEq/L (ref 137–147)

## 2014-02-05 LAB — CBC WITH DIFFERENTIAL/PLATELET
Basophils Absolute: 0 10*3/uL (ref 0.0–0.1)
Basophils Relative: 0 % (ref 0–1)
EOS ABS: 0.1 10*3/uL (ref 0.0–0.7)
EOS PCT: 1 % (ref 0–5)
HCT: 35.5 % — ABNORMAL LOW (ref 39.0–52.0)
HEMOGLOBIN: 12.4 g/dL — AB (ref 13.0–17.0)
Lymphocytes Relative: 17 % (ref 12–46)
Lymphs Abs: 1.9 10*3/uL (ref 0.7–4.0)
MCH: 31.5 pg (ref 26.0–34.0)
MCHC: 34.9 g/dL (ref 30.0–36.0)
MCV: 90.1 fL (ref 78.0–100.0)
MONOS PCT: 8 % (ref 3–12)
Monocytes Absolute: 0.9 10*3/uL (ref 0.1–1.0)
Neutro Abs: 8.4 10*3/uL — ABNORMAL HIGH (ref 1.7–7.7)
Neutrophils Relative %: 74 % (ref 43–77)
Platelets: 121 10*3/uL — ABNORMAL LOW (ref 150–400)
RBC: 3.94 MIL/uL — AB (ref 4.22–5.81)
RDW: 13.4 % (ref 11.5–15.5)
WBC: 11.3 10*3/uL — ABNORMAL HIGH (ref 4.0–10.5)

## 2014-02-05 MED ORDER — ONDANSETRON 4 MG PO TBDP: 4.0000 mg | ORAL_TABLET | Freq: Three times a day (TID) | ORAL | Status: DC | PRN

## 2014-02-05 NOTE — ED Notes (Signed)
PTAR to arrived for pt to return to [facility].

## 2014-02-05 NOTE — ED Notes (Signed)
Per EMS pt had 1 x episode of emesis with blood tinge; Nursing facility tested + for blood in vomit; Pt has hx of dementia with a prior brain injury; pt able to answer yes and no to question; 144/82 HR =80 denies pain. No SOB or other issues

## 2014-02-05 NOTE — Discharge Instructions (Signed)
Your evaluation has been largely reassuring.  Do not hesitate to return for concerning changes in your condition.

## 2014-02-05 NOTE — ED Notes (Signed)
MD at bedside. 

## 2014-02-05 NOTE — ED Provider Notes (Signed)
CSN: [id number]     Arrival date & time 02/05/14  0554 History   First MD Initiated Contact with Patient 02/05/14 0601     Chief Complaint  Patient presents with  . Hematemesis     (Consider location/radiation/quality/duration/timing/severity/associated sxs/prior Treatment) HPI Patient presents from nursing home after one episode of vomiting, possible hematemesis. Patient has prior traumatic brain injury, hemorrhagic damage, and dementia. History of present illness is limited secondary to the patient's essentially noncommunicative status beyond the yes no answers. Per reports the patient was in his nursing facility when he had one episode of spontaneous vomiting.  Apparently there was a blood tinged to the vomit, and on testing he was Hemoccult positive. No report of behavioral changes, vital sign changes, diet changes, other changes in the patient's behavior or activities. Per EMS, the patient had heart rate 80, blood pressure 144/82 in route, was awake, alert, in no distress throughout transfer.  Past Medical History  Diagnosis Date  . Subdural hematoma, post-traumatic   . Chronic indwelling Foley catheter   . Dementia with aggressive behavior   . Frequent falls   . H/O alcohol abuse   . DM (diabetes mellitus)   . HTN (hypertension)   . Sinus tachycardia   . Seizures   . GERD (gastroesophageal reflux disease)    History reviewed. No pertinent past surgical history. History reviewed. No pertinent family history. History  Substance Use Topics  . Smoking status: Former Games developermoker  . Smokeless tobacco: Not on file  . Alcohol Use: No    Review of Systems  Unable to perform ROS: Dementia      Allergies  Review of patient's allergies indicates no known allergies.  Home Medications   Prior to Admission medications   Medication Sig Start Date End Date Taking? Authorizing Provider  acetaminophen (TYLENOL) 325 MG tablet 650 mg by PEG Tube route every 8 (eight) hours as needed.  For fever/pain    Historical Provider, MD  albuterol (PROVENTIL) (2.5 MG/3ML) 0.083% nebulizer solution Take 2.5 mg by nebulization every 4 (four) hours as needed. For shortness of breath    Historical Provider, MD  AMBULATORY NON FORMULARY MEDICATION Medication Name: Lorazepam 0.5 mg/ml gel YQM:VHQIOSig:Apply 1ml topically to skin twice daily routinely for agitation; Apply 1ml topically to skin every 6 hours as needed for agitation 08/17/13   Kimber RelicArthur G Green, MD  carvedilol (COREG) 12.5 MG tablet 12.5 mg by PEG Tube route 2 (two) times daily with a meal.    Historical Provider, MD  famotidine (PEPCID) 20 MG tablet Take 1 tablet (20 mg total) by mouth daily. 07/19/13   Sharee Holstereborah S Green, NP  food thickener (THICK IT) POWD Take 1 Container by mouth as needed. Nectar thick    Historical Provider, MD  levETIRAcetam (KEPPRA) 500 MG tablet Take 500 mg by mouth 2 (two) times daily. For seizures    Historical Provider, MD  levothyroxine (SYNTHROID) 175 MCG tablet Take 1 tablet (175 mcg total) by mouth daily before breakfast. 01/05/14   Sharee Holstereborah S Green, NP  LORazepam (ATIVAN) 2 MG/ML injection Inject 0.875ml intramuscularly as needed for agitation; May repeat 0.485ml intramuscularly in 30 minutes if agitation continues; Inject 0.725ml intramuscularly every shift if resident is combative or violent 12/20/13   Tiffany L Reed, DO  QUEtiapine (SEROQUEL) 300 MG tablet Take 300 mg by mouth at bedtime. Take 1/2 tablet = 150 mg by mouth twice daily  For mood disorder/alzheimers.    Historical Provider, MD  QUEtiapine (SEROQUEL) 50 MG tablet  Take 150 mg by mouth 2 (two) times daily.    Historical Provider, MD  rivastigmine (EXELON) 9.5 mg/24hr Place 9.5 mg onto the skin daily. For dementia    Historical Provider, MD  sertraline (ZOLOFT) 25 MG tablet Take 75 mg by mouth daily. For depression and anxiety    Historical Provider, MD  thiamine (VITAMIN B-1) 100 MG tablet Take 1 tablet (100 mg total) by mouth daily. 07/19/13   Sharee Holster, NP    BP 140/82  Pulse 71  Temp(Src) 98.5 F (36.9 C) (Oral)  Resp 20  SpO2 96% Physical Exam  Nursing note and vitals reviewed. Constitutional: He is oriented to person, place, and time. He appears well-developed. No distress.  HENT:  Head: Normocephalic and atraumatic.  Eyes: Conjunctivae and EOM are normal.  Cardiovascular: Normal rate and regular rhythm.   Pulmonary/Chest: Effort normal. No stridor. No respiratory distress.  Abdominal: He exhibits no distension.  Musculoskeletal: He exhibits no edema.  Neurological: He is alert and oriented to person, place, and time.  Skin: Skin is warm and dry.  Psychiatric: He is slowed and withdrawn. Cognition and memory are impaired.  Borderline noncommunicative beyond yes / no responses    ED Course  Procedures (including critical care time) Labs Review Labs Reviewed  CBC WITH DIFFERENTIAL  BASIC METABOLIC PANEL   I reviewed the laboratory study results.   Pulse oximetry 99% room air normal  MDM  Patient presents hemodynamically stable, in no distress from his nursing facility after one episode of vomiting with blood tinged. Patient is awake alert, though his dementia, and essentially noncommunicative status are somewhat limiting, the patient is in no distress, with no other complaints.  With reassuring vital signs, lab findings, patient was discharged to return to his nursing facility.   Gerhard Munch, MD 02/06/14 [id number]

## 2014-02-21 ENCOUNTER — Non-Acute Institutional Stay (SKILLED_NURSING_FACILITY): Payer: PRIVATE HEALTH INSURANCE | Admitting: Internal Medicine

## 2014-02-21 DIAGNOSIS — F0391 Unspecified dementia with behavioral disturbance: Secondary | ICD-10-CM

## 2014-02-21 DIAGNOSIS — K219 Gastro-esophageal reflux disease without esophagitis: Secondary | ICD-10-CM

## 2014-02-21 DIAGNOSIS — K920 Hematemesis: Secondary | ICD-10-CM

## 2014-02-21 DIAGNOSIS — F03918 Unspecified dementia, unspecified severity, with other behavioral disturbance: Secondary | ICD-10-CM

## 2014-03-21 ENCOUNTER — Encounter: Payer: Self-pay | Admitting: Internal Medicine

## 2014-03-21 ENCOUNTER — Non-Acute Institutional Stay (SKILLED_NURSING_FACILITY): Payer: PRIVATE HEALTH INSURANCE | Admitting: Internal Medicine

## 2014-03-21 DIAGNOSIS — F39 Unspecified mood [affective] disorder: Secondary | ICD-10-CM

## 2014-03-21 DIAGNOSIS — E039 Hypothyroidism, unspecified: Secondary | ICD-10-CM

## 2014-03-21 DIAGNOSIS — K219 Gastro-esophageal reflux disease without esophagitis: Secondary | ICD-10-CM

## 2014-03-21 DIAGNOSIS — N492 Inflammatory disorders of scrotum: Secondary | ICD-10-CM

## 2014-03-21 DIAGNOSIS — F0391 Unspecified dementia with behavioral disturbance: Secondary | ICD-10-CM

## 2014-03-21 DIAGNOSIS — N498 Inflammatory disorders of other specified male genital organs: Secondary | ICD-10-CM

## 2014-03-21 DIAGNOSIS — F03918 Unspecified dementia, unspecified severity, with other behavioral disturbance: Secondary | ICD-10-CM

## 2014-03-21 NOTE — Progress Notes (Signed)
Patient ID: Ricardo [name], male   DOB: [birth year], 79 y.o.   MRN: [id number]    Facility: [facility]  HPI:   79 y/o male patient is seen for routine visit. He has hx of dementia, subdural hematoma,depression, anxiety, htn, dm, seizure, hypothyroidism. He has been at his baseline recently. Staff have noticed infection in his skin area of the scrotum on 03/18/14. Wound nurse then evaluated the patient and had noticed a firm nodule in the scrotal area. He was given a dose of rocephin im and then started on doxycycile 100 mg bid on 03/18/14 for a week   Review of Systems:  Unable to obtain due to his dementia No fever or chills reported  Past Medical History  Diagnosis Date  . Subdural hematoma, post-traumatic   . Chronic indwelling Foley catheter   . Dementia with aggressive behavior   . Frequent falls   . H/O alcohol abuse   . DM (diabetes mellitus)   . HTN (hypertension)   . Sinus tachycardia   . Seizures   . GERD (gastroesophageal reflux disease)    Current Outpatient Prescriptions on File Prior to Visit  Medication Sig Dispense Refill  . acetaminophen (TYLENOL) 325 MG tablet 650 mg by PEG Tube route every 8 (eight) hours as needed. For fever/pain      . albuterol (PROVENTIL) (2.5 MG/3ML) 0.083% nebulizer solution Take 2.5 mg by nebulization every 4 (four) hours as needed. For shortness of breath      . AMBULATORY NON FORMULARY MEDICATION Medication Name: Lorazepam 0.5 mg/ml gel WUJ:[id number] 1ml topically to skin twice daily routinely for agitation; Apply 1ml topically to skin every 6 hours as needed for agitation  60 mL  5  . carvedilol (COREG) 12.5 MG tablet 12.5 mg by PEG Tube route 2 (two) times daily with a meal.      . famotidine (PEPCID) 20 MG tablet Take 1 tablet (20 mg total) by mouth daily.  30 tablet  12  . food thickener (THICK IT) POWD Take 1 Container by mouth as needed. Nectar thick      . levETIRAcetam (KEPPRA) 500 MG tablet Take 500  mg by mouth 2 (two) times daily. For seizures      . levothyroxine (SYNTHROID) 175 MCG tablet Take 1 tablet (175 mcg total) by mouth daily before breakfast.  30 tablet  11  . LORazepam (ATIVAN) 2 MG/ML injection Inject 0.45ml intramuscularly as needed for agitation; May repeat 0.70ml intramuscularly in 30 minutes if agitation continues; Inject 0.70ml intramuscularly every shift if resident is combative or violent  1 mL  5  . QUEtiapine (SEROQUEL) 300 MG tablet Take 300 mg by mouth at bedtime. Take 1/2 tablet = 150 mg by mouth twice daily  For mood disorder/alzheimers.      . rivastigmine (EXELON) 9.5 mg/24hr Place 9.5 mg onto the skin daily. For dementia      . sertraline (ZOLOFT) 25 MG tablet Take 75 mg by mouth daily. For depression and anxiety      . thiamine (VITAMIN B-1) 100 MG tablet Take 1 tablet (100 mg total) by mouth daily.  30 tablet  12   No current facility-administered medications on file prior to visit.    Physical exam BP 110/70  Pulse 78  Temp(Src) 97.8 F (36.6 C)  Resp 19  SpO2 96%  Charge nurse present during exam  General- elderly male in no acute distress, has confusion at baseline Head- atraumatic, normocephalic Eyes- PERRLA, EOMI,  no pallor, no icterus Neck- no lymphadenopathy, no thyromegaly, no jugular vein distension Cardiovascular- normal s1,s2, no murmurs/ rubs/ gallops Respiratory- bilateral clear to auscultation, no wheeze, no rhonchi, no crackles Abdomen- bowel sounds present, soft, non tender Musculoskeletal- able to move all 4 extremities, on wheelchair, self propels Neurological- no new focal deficit Psychiatry- alert and calm this visit Genitalia- There is opening on his left buttock area, tender to touch but as per wound nurse the size has decreased and has erythema. On exam, open area with slough noted but no active drainage in the left buttock area. Firm palpable mass in left buttock area and tender to touch Scrotal area is red, no edema, non  tender No palpable fluid area in scrotal area No palpable enlarged inguinal lymph node  Assessment/plan  Scrotal abscess Self drained. Clinically imporved and remains afebrile.Continue doxycyline 100 mg bid for total 10 days course.Will need scrotal and gluteal ultrasound if worsens. Add fluconazole 100 mg daily x 3 days for incontinence related groin erythema. Clean with normal saline and apply dry gauze. Cbc with diff and cmp next lab  Mood disorder Stable, continue seroquel and zoloft with ativan  Hypothyroidism Continue levothyroxine 175 mcg daily  gerd Continue prilosec for now and monitor clinically  Dementia Continue his exelon patch

## 2014-03-26 NOTE — Progress Notes (Signed)
Patient ID: Ricardo [name], male   DOB: [birth year], 79 y.o.   MRN: [id number]    Facility: [facility]  Chief complaint- routine visit  Allergies reviewed- biaxin  HPI:   79 y/o male patient with advanced dementia is seen for routine visit. He was in the ED recently for ? Hematemesis and with h&h stable and no further episodes and his VSS, he was sent back to facility. Mo further episodes of vomiting blood, coughing blood or blood in stool/ urine. He has been at his baseline with stable weight. Difficult to obtain hpi and ros from patient.   Review of Systems:  Unable to obtain  Medication reviewed. See [facility]  Physical exam BP 108/62  Pulse 60  Temp(Src) 98 F (36.7 C)  Resp 18  Wt 146 lb (66.225 kg)  SpO2 95%  General- elderly male in no acute distress, has confusion at baseline Head- atraumatic, normocephalic Eyes- PERRLA, EOMI, no pallor, no icterus Neck- no lymphadenopathy, no thyromegaly, no jugular vein distension Cardiovascular- normal s1,s2, no murmurs Respiratory- bilateral clear to auscultation, no wheeze, no rhonchi, no crackles Abdomen- bowel sounds present, soft, non tender Musculoskeletal- able to move all 4 extremities, on wheelchair, self propels Neurological- no new focal deficit Psychiatry- alert and calm this visit  Labs- CBC Latest Ref Rng 02/05/2014 02/05/2014 11/24/2013  WBC 4.0 - 10.5 K/uL - 11.3(H) 7.2  Hemoglobin 13.0 - 17.0 g/dL 12.9(L) 12.4(L) 11.6(A)  Hematocrit 39.0 - 52.0 % 38.0(L) 35.5(L) 36(A)  Platelets 150 - 400 K/uL - 121(L) 120(A)    Assessment/Plan  Hematemesis Stable h&h, on prilosec 40 mg daily, continue this and wean to 20 mg daily and monitor h&h  Dementia with behavioral disturbance Vascular and alcohol induced. mmse 2/30 in 2014. Continue exelon patch for now. Continue lorazepam and zoloft for now.   gerd Continue PPI, monitor for bleeding episode, avoid NSAIDs

## 2014-04-18 ENCOUNTER — Encounter: Payer: Self-pay | Admitting: Internal Medicine

## 2014-04-18 ENCOUNTER — Non-Acute Institutional Stay (SKILLED_NURSING_FACILITY): Payer: PRIVATE HEALTH INSURANCE | Admitting: Internal Medicine

## 2014-04-18 DIAGNOSIS — G40309 Generalized idiopathic epilepsy and epileptic syndromes, not intractable, without status epilepticus: Secondary | ICD-10-CM

## 2014-04-18 DIAGNOSIS — I798 Other disorders of arteries, arterioles and capillaries in diseases classified elsewhere: Secondary | ICD-10-CM

## 2014-04-18 DIAGNOSIS — F0151 Vascular dementia with behavioral disturbance: Secondary | ICD-10-CM

## 2014-04-18 DIAGNOSIS — G309 Alzheimer's disease, unspecified: Secondary | ICD-10-CM

## 2014-04-18 DIAGNOSIS — E1151 Type 2 diabetes mellitus with diabetic peripheral angiopathy without gangrene: Secondary | ICD-10-CM

## 2014-04-18 DIAGNOSIS — I10 Essential (primary) hypertension: Secondary | ICD-10-CM

## 2014-04-18 DIAGNOSIS — E038 Other specified hypothyroidism: Secondary | ICD-10-CM

## 2014-04-18 DIAGNOSIS — K219 Gastro-esophageal reflux disease without esophagitis: Secondary | ICD-10-CM

## 2014-04-18 DIAGNOSIS — K5900 Constipation, unspecified: Secondary | ICD-10-CM

## 2014-04-18 DIAGNOSIS — I739 Peripheral vascular disease, unspecified: Secondary | ICD-10-CM | POA: Insufficient documentation

## 2014-04-18 DIAGNOSIS — F411 Generalized anxiety disorder: Secondary | ICD-10-CM

## 2014-04-18 DIAGNOSIS — F0630 Mood disorder due to known physiological condition, unspecified: Secondary | ICD-10-CM

## 2014-04-18 DIAGNOSIS — F01518 Vascular dementia, unspecified severity, with other behavioral disturbance: Secondary | ICD-10-CM

## 2014-04-18 NOTE — Progress Notes (Signed)
Patient ID: Ricardo [name], male   DOB: 12-Feb-1935, 79 y.o.   MRN: [id number]    Facility: [facility]  Chief Complaint  Patient presents with  . Annual Exam   Allergies  Allergen Reactions  . Biaxin [Clarithromycin]    Code:full code  HPI:   79 y/o male patient is seen for annual exam today.He has hx of subdural hematoma, advanced dementia, depression, anxiety, htn, dm, seizure, hypothyroidism. He was recently treated for scrotal abscess, has completed his antibiotics and as per staff his wound has healed. He is afebrile, confused and at his baseline today. His weight has been stable. He is followed by psych services.  Difficult to obtain hpi and ros from patient.  No falls reported. No new skin concern  Review of Systems:  Unable to obtain  Past Medical History  Diagnosis Date  . Subdural hematoma, post-traumatic   . Chronic indwelling Foley catheter   . Dementia with aggressive behavior   . Frequent falls   . H/O alcohol abuse   . DM (diabetes mellitus)   . HTN (hypertension)   . Sinus tachycardia   . Seizures   . GERD (gastroesophageal reflux disease)      Medication List       This list is accurate as of: 04/18/14 11:03 AM.  Always use your most recent med list.               acetaminophen 325 MG tablet  Commonly known as:  TYLENOL  650 mg by PEG Tube route every 8 (eight) hours as needed. For fever/pain     albuterol (2.5 MG/3ML) 0.083% nebulizer solution  Commonly known as:  PROVENTIL  Take 2.5 mg by nebulization every 4 (four) hours as needed. For shortness of breath     AMBULATORY NON FORMULARY MEDICATION  - Medication Name: Lorazepam 0.5 mg/ml gel  - WUJ:WJXBJSig:Apply 1ml topically to skin twice daily routinely for agitation; Apply 1ml topically to skin every 6 hours as needed for agitation     carvedilol 12.5 MG tablet  Commonly known as:  COREG  12.5 mg by PEG Tube route 2 (two) times daily with a meal.     divalproex 125 MG capsule  Commonly known as:  DEPAKOTE SPRINKLE  Take 500 mg by mouth 3 (three) times daily.     docusate 60 MG/15ML syrup  Commonly known as:  COLACE  Take 100 mg by mouth 2 (two) times daily.     famotidine 20 MG tablet  Commonly known as:  PEPCID  Take 1 tablet (20 mg total) by mouth daily.     food thickener Powd  Commonly known as:  THICK IT  Take 1 Container by mouth as needed. Nectar thick     levETIRAcetam 500 MG tablet  Commonly known as:  KEPPRA  Take 500 mg by mouth 2 (two) times daily. For seizures     levothyroxine 175 MCG tablet  Commonly known as:  SYNTHROID  Take 1 tablet (175 mcg total) by mouth daily before breakfast.     LORazepam 0.5 MG tablet  Commonly known as:  ATIVAN  Take 0.5 mg by mouth 2 (two) times daily.     LORazepam 2 MG/ML injection  Commonly known as:  ATIVAN  Inject 0.95ml intramuscularly as needed for agitation; May repeat 0.485ml intramuscularly in 30 minutes if agitation continues; Inject 0.685ml intramuscularly every shift if resident is combative or violent     omeprazole 20 MG capsule  Commonly known as:  PRILOSEC  Take 20 mg by mouth daily.     QUEtiapine 300 MG tablet  Commonly known as:  SEROQUEL  Take 300 mg by mouth at bedtime. Take 1/2 tablet = 150 mg by mouth twice daily  For mood disorder/alzheimers.     rivastigmine 9.5 mg/24hr  Commonly known as:  EXELON  Place 9.5 mg onto the skin daily. For dementia     sertraline 25 MG tablet  Commonly known as:  ZOLOFT  Take 75 mg by mouth daily. For depression and anxiety     thiamine 100 MG tablet  Commonly known as:  VITAMIN B-1  Take 1 tablet (100 mg total) by mouth daily.       No past surgical history on file. No family history on file.  History   Social History  . Marital Status: Single    Spouse Name: N/A    Number of Children: N/A  . Years of Education: N/A   Occupational History  . Not on file.   Social History Main Topics  . Smoking  status: Former Games developermoker  . Smokeless tobacco: Not on file  . Alcohol Use: No  . Drug Use: No  . Sexual Activity: Not on file   Other Topics Concern  . Not on file   Social History Narrative  . No narrative on file   Physical exam BP 108/70  Pulse 82  Temp(Src) 98.5 F (36.9 C)  Resp 18  Ht 5\' 7"  (1.702 m)  Wt 140 lb 3.2 oz (63.594 kg)  BMI 21.95 kg/m2  SpO2 96%  General- elderly male in no acute distress, has confusion at baseline Head- atraumatic, normocephalic Eyes- PERRLA, EOMI, no pallor, no icterus Neck- no lymphadenopathy, no thyromegaly, no jugular vein distension Cardiovascular- normal s1,s2, no murmurs/ rubs/ gallops Respiratory- bilateral clear to auscultation, no wheeze, no rhonchi, no crackles Abdomen- bowel sounds present, soft, non tender Musculoskeletal- able to move all 4 extremities, on wheelchair, self propels Neurological- no new focal deficit Psychiatry- alert and calm this visit  Labs: 03/23/14 wbc 9.5, hb 11.6, hct 36.5, plt 192, na 142, k 4.2, bun 26, cr 0.9, glu 154, ca 9.2  Assessment/plan  DM with peripheral circulatory disorder Stable, last a1c 5.7 on 02/10/14. Is diet controlled. Off all medications. Not on ACEI or statin. Check urine microalbumin and lipid panel  Scrotal abscess Resolved, completed antibiotics.   Peripheral angiopathy Stable currently  HTN Stable. Continue coreg and monitor bp  Mood disorder Stable, continue depakote, seroquel and zoloft. Followed by psych services  Hypothyroidism Continue levothyroxine 175 mcg daily  gerd Continue pepcid and prilosec for now and monitor clinically  Dementia with behavioral disturbance Continue his exelon patch and mood meds  Constipation Stable, continue colace  Anxiety Continue current regimen ativan  Seizure Remains seizure free, continue keppra and dilantin.

## 2014-06-02 ENCOUNTER — Non-Acute Institutional Stay (SKILLED_NURSING_FACILITY): Payer: PRIVATE HEALTH INSURANCE | Admitting: Internal Medicine

## 2014-06-02 DIAGNOSIS — K219 Gastro-esophageal reflux disease without esophagitis: Secondary | ICD-10-CM

## 2014-06-02 DIAGNOSIS — I1310 Hypertensive heart and chronic kidney disease without heart failure, with stage 1 through stage 4 chronic kidney disease, or unspecified chronic kidney disease: Secondary | ICD-10-CM

## 2014-06-02 DIAGNOSIS — N182 Chronic kidney disease, stage 2 (mild): Secondary | ICD-10-CM

## 2014-06-02 DIAGNOSIS — E1122 Type 2 diabetes mellitus with diabetic chronic kidney disease: Secondary | ICD-10-CM

## 2014-06-02 NOTE — Progress Notes (Signed)
Patient ID: Ricardo [name], male   DOB: 06-Nov-1934, 79 y.o.   MRN: [id number]    Facility: [facility]   Chief complaint- medical management of chronic issues  Code status- full code  Allergies- biaxin  HPI:   79 y/o male patient is seen for routine visit today. He had a fall on 05/05/14. No further falls after that. no recent seizure reported. Behavior has been calm on current regimen. bp readings stable. Unable to obtain HPI or ROS with his dementia.   Review of Systems:  Unable to obtain  Past Medical History  Diagnosis Date  . Subdural hematoma, post-traumatic   . Chronic indwelling Foley catheter   . Dementia with aggressive behavior   . Frequent falls   . H/O alcohol abuse   . DM (diabetes mellitus)   . HTN (hypertension)   . Sinus tachycardia   . Seizures   . GERD (gastroesophageal reflux disease)    [facility]  Physical exam BP 110/64 mmHg  Pulse 60  Temp(Src) 97.8 F (36.6 C)  Resp 18  Wt 151 lb 6.4 oz (68.675 kg)  SpO2 95%  General- elderly male in no acute distress, confusion at baseline Cardiovascular- normal s1,s2, no murmurs Respiratory- bilateral clear to auscultation, no wheeze, no rhonchi, no crackles Abdomen- bowel sounds present, soft, non tender Musculoskeletal- able to move all 4 extremities, on wheelchair, self propels Psychiatry- alert and calm this visit  Labs reviewed  Assessment/plan  Type 2 dm with ckd Currently diet controlled, off all medications. Monitor a1c periodically  ckd stage 2 Monitor renal function, avoid nsaids, have bp under control  HTN bp currently stable on coreg, continue current regimen, no changes made  gerd Continue prilosec, symptoms controlled

## 2014-06-27 ENCOUNTER — Non-Acute Institutional Stay (SKILLED_NURSING_FACILITY): Payer: PRIVATE HEALTH INSURANCE | Admitting: Internal Medicine

## 2014-06-27 DIAGNOSIS — F01518 Vascular dementia, unspecified severity, with other behavioral disturbance: Secondary | ICD-10-CM

## 2014-06-27 DIAGNOSIS — N182 Chronic kidney disease, stage 2 (mild): Secondary | ICD-10-CM | POA: Insufficient documentation

## 2014-06-27 DIAGNOSIS — F0151 Vascular dementia with behavioral disturbance: Secondary | ICD-10-CM

## 2014-06-27 DIAGNOSIS — F29 Unspecified psychosis not due to a substance or known physiological condition: Secondary | ICD-10-CM | POA: Insufficient documentation

## 2014-06-27 DIAGNOSIS — K219 Gastro-esophageal reflux disease without esophagitis: Secondary | ICD-10-CM | POA: Insufficient documentation

## 2014-06-27 DIAGNOSIS — E1151 Type 2 diabetes mellitus with diabetic peripheral angiopathy without gangrene: Secondary | ICD-10-CM

## 2014-06-27 DIAGNOSIS — E1122 Type 2 diabetes mellitus with diabetic chronic kidney disease: Secondary | ICD-10-CM | POA: Insufficient documentation

## 2014-06-27 DIAGNOSIS — I798 Other disorders of arteries, arterioles and capillaries in diseases classified elsewhere: Secondary | ICD-10-CM

## 2014-06-27 DIAGNOSIS — I1310 Hypertensive heart and chronic kidney disease without heart failure, with stage 1 through stage 4 chronic kidney disease, or unspecified chronic kidney disease: Secondary | ICD-10-CM | POA: Insufficient documentation

## 2014-06-27 DIAGNOSIS — G40909 Epilepsy, unspecified, not intractable, without status epilepticus: Secondary | ICD-10-CM | POA: Insufficient documentation

## 2014-06-27 NOTE — Progress Notes (Signed)
Patient ID: Ricardo [name], male   DOB: 09/24/1934, 79 y.o.   MRN: [id number]    Facility: [facility]   Chief complaint- medical management of chronic issues  Code status- full code  Allergies- biaxin  HPI:   79 y/o male patient is seen for routine visit today. He has dementia with behavioral changes. There was concern for possible seizure on 06/17/14 after which his keppra dose was increased. valrpoic acid level was therapeutic. His dementia is severe and limits HPI and ROS. No new concern from staff. Weight stable on review. No falls reported.   Review of Systems:  Unable to obtain  Past Medical History  Diagnosis Date  . Subdural hematoma, post-traumatic   . Chronic indwelling Foley catheter   . Dementia with aggressive behavior   . Frequent falls   . H/O alcohol abuse   . DM (diabetes mellitus)   . HTN (hypertension)   . Sinus tachycardia   . Seizures   . GERD (gastroesophageal reflux disease)      Medication List       This list is accurate as of: 06/27/14 12:32 PM.  Always use your most recent med list.               acetaminophen 325 MG tablet  Commonly known as:  TYLENOL  650 mg by PEG Tube route every 8 (eight) hours as needed. For fever/pain     AMBULATORY NON FORMULARY MEDICATION  - Medication Name: Lorazepam 0.5 mg/ml gel  - HQI:ONGEXSig:Apply 1ml topically to skin twice daily routinely for agitation; Apply 1ml topically to skin every 6 hours as needed for agitation     carvedilol 12.5 MG tablet  Commonly known as:  COREG  12.5 mg by PEG Tube route 2 (two) times daily with a meal.     divalproex 125 MG capsule  Commonly known as:  DEPAKOTE SPRINKLE  Take 500 mg by mouth 3 (three) times daily.     docusate 60 MG/15ML syrup  Commonly known as:  COLACE  Take 100 mg by mouth 2 (two) times daily.     famotidine 20 MG tablet  Commonly known as:  PEPCID  Take 1 tablet (20 mg total) by mouth daily.     levETIRAcetam 500 MG tablet    Commonly known as:  KEPPRA  Take 750 mg by mouth 2 (two) times daily. For seizures     levothyroxine 175 MCG tablet  Commonly known as:  SYNTHROID  Take 1 tablet (175 mcg total) by mouth daily before breakfast.     LORazepam 0.5 MG tablet  Commonly known as:  ATIVAN  Take 0.5 mg by mouth 2 (two) times daily.     LORazepam 2 MG/ML injection  Commonly known as:  ATIVAN  Inject 0.655ml intramuscularly as needed for agitation; May repeat 0.405ml intramuscularly in 30 minutes if agitation continues; Inject 0.615ml intramuscularly every shift if resident is combative or violent     omeprazole 20 MG capsule  Commonly known as:  PRILOSEC  Take 20 mg by mouth daily.     QUEtiapine 300 MG tablet  Commonly known as:  SEROQUEL  Take 300 mg by mouth at bedtime. Take 1/2 tablet = 150 mg by mouth twice daily  For mood disorder/alzheimers.     rivastigmine 9.5 mg/24hr  Commonly known as:  EXELON  Place 9.5 mg onto the skin daily. For dementia     sertraline 25 MG tablet  Commonly known as:  ZOLOFT  Take 75 mg by mouth daily. For depression and anxiety     thiamine 100 MG tablet  Commonly known as:  VITAMIN B-1  Take 1 tablet (100 mg total) by mouth daily.       Physical exam BP 120/78 mmHg  Pulse 64  Temp(Src) 97.6 F (36.4 C)  Resp 18  Wt 147 lb 9.6 oz (66.951 kg)  General- elderly male in no acute distress, confusion at baseline Head- atraumatic, normocephalic Eyes- PERRLA, EOMI, no pallor, no icterus Neck- no lymphadenopathy Cardiovascular- normal s1,s2, no murmurs Respiratory- bilateral clear to auscultation, no wheeze, no rhonchi, no crackles Abdomen- bowel sounds present, soft, non tender Musculoskeletal- able to move all 4 extremities, on wheelchair, self propels Psychiatry- alert and calm this visit  Labs: 03/23/14 wbc 9.5, hb 11.6, hct 36.5, plt 192, na 142, k 4.2, bun 26, cr 0.9, glu 154, ca 9.2 05/27/14 wbc 8.3, hb 12.8, hct 39.4, plt 136, na 143, k 3.9, cl 106, bun 27,  cr 0.8, glu 167, ca 9.2, tsh 1.79, a1c 6.2 06/16/14 wbc 8, hb 12.3, hct 37.5, plt 121, na 142, k 4, bun 27, cr 1.1, glu 174, ca 9.2, VPA 70.2  Assessment/plan  Seizure Remains seizure free at present. Tolerating increased dosing of keppra well 750 mg bid. continue depakote. Seizure precuations  Vascular dementia with behavioral disturbance Stable for now, continue exelon patch with seroquel and zoloft along with prn ativan  DM with peripheral circulatory disorder Stable, a1c 6.2. Diet controlled for now, off all meds  Psychosis Calm this visit, as per staff tolerating medication well. Continue seroquel 150 mg bid and monitor clinically

## 2014-07-21 ENCOUNTER — Non-Acute Institutional Stay (SKILLED_NURSING_FACILITY): Payer: Medicare Other | Admitting: Internal Medicine

## 2014-07-21 DIAGNOSIS — F0391 Unspecified dementia with behavioral disturbance: Secondary | ICD-10-CM

## 2014-07-21 DIAGNOSIS — E559 Vitamin D deficiency, unspecified: Secondary | ICD-10-CM

## 2014-07-21 DIAGNOSIS — R569 Unspecified convulsions: Secondary | ICD-10-CM

## 2014-07-21 DIAGNOSIS — K219 Gastro-esophageal reflux disease without esophagitis: Secondary | ICD-10-CM

## 2014-07-21 DIAGNOSIS — F0392 Unspecified dementia, unspecified severity, with psychotic disturbance: Secondary | ICD-10-CM

## 2014-07-25 ENCOUNTER — Other Ambulatory Visit: Payer: Self-pay | Admitting: *Deleted

## 2014-07-25 MED ORDER — LORAZEPAM 0.5 MG PO TABS: ORAL_TABLET | ORAL | Status: DC

## 2014-07-25 NOTE — Telephone Encounter (Signed)
[facility] 

## 2014-07-30 DIAGNOSIS — E559 Vitamin D deficiency, unspecified: Secondary | ICD-10-CM | POA: Insufficient documentation

## 2014-07-30 DIAGNOSIS — R569 Unspecified convulsions: Secondary | ICD-10-CM | POA: Insufficient documentation

## 2014-07-30 DIAGNOSIS — F0392 Unspecified dementia, unspecified severity, with psychotic disturbance: Secondary | ICD-10-CM | POA: Insufficient documentation

## 2014-07-30 DIAGNOSIS — F0391 Unspecified dementia with behavioral disturbance: Secondary | ICD-10-CM | POA: Insufficient documentation

## 2014-07-30 NOTE — Progress Notes (Signed)
Patient ID: Ricardo [name], male   DOB: 07/19/1934, 80 y.o.   MRN: [id number]    Facility: [facility]   Chief complaint- medical management of chronic issues  Code status- full code  Allergies- biaxin  HPI:   80 y/o male patient is seen for routine visit today. He has been at his baseline with his advanced dementia and behavioral issues. He has been calmer these days as per staff. No falls reported. He is now off pepcid and ppi is keeping his reflux under control. He has been started on vitamin d supplement and is tolerating it fair. His dementia is severe and limits HPI and ROS.   Review of Systems:  Unable to obtain  Medications reviewed  Past medical history reviewed, unchanged  Physical exam BP 106/72 mmHg  Pulse 76  Temp(Src) 98.5 F (36.9 C)  Resp 18  SpO2 96%  General- elderly male in no acute distress, pleasantly confused at baseline Head- atraumatic, normocephalic Neck- no lymphadenopathy Cardiovascular- normal s1,s2, no murmurs Respiratory- bilateral clear to auscultation, no wheeze, no rhonchi, no crackles Abdomen- bowel sounds present, soft, non tender Musculoskeletal- able to move all 4 extremities, on wheelchair, self propels, needs assistance with ADLs Psychiatry- alert and calm this visit  Labs: 03/23/14 wbc 9.5, hb 11.6, hct 36.5, plt 192, na 142, k 4.2, bun 26, cr 0.9, glu 154, ca 9.2 05/27/14 wbc 8.3, hb 12.8, hct 39.4, plt 136, na 143, k 3.9, cl 106, bun 27, cr 0.8, glu 167, ca 9.2, tsh 1.79, a1c 6.2 06/16/14 wbc 8, hb 12.3, hct 37.5, plt 121, na 142, k 4, bun 27, cr 1.1, glu 174, ca 9.2, VPA 70.2  Assessment/plan  Reflux disease symptoms controlled. Off pepcid, continue omeprazole 20 mg daily.  Psychosis with dementia Stable, followed by psych services. Continue depakote 500 mg tid, seroquel 75 mg bid, zoloft 75 mg daily and his exelon patch. Continue fall precautions, skin care and monitor his oral intake.  Vitamin d  def Continue vitamin d 1000 u daily  Seizure Remains seizure free at present. Tolerating increased dosing of keppra well 750 mg bid. continue depakote. Seizure precuations

## 2014-08-15 ENCOUNTER — Emergency Department (HOSPITAL_COMMUNITY): Payer: Medicare Other

## 2014-08-15 ENCOUNTER — Other Ambulatory Visit: Payer: Self-pay | Admitting: *Deleted

## 2014-08-15 ENCOUNTER — Inpatient Hospital Stay (HOSPITAL_COMMUNITY)
Admission: EM | Admit: 2014-08-15 | Discharge: 2014-09-06 | DRG: 871 | Disposition: E | Payer: Medicare Other | Attending: Internal Medicine | Admitting: Internal Medicine

## 2014-08-15 ENCOUNTER — Encounter (HOSPITAL_COMMUNITY): Payer: Self-pay | Admitting: Emergency Medicine

## 2014-08-15 DIAGNOSIS — J811 Chronic pulmonary edema: Secondary | ICD-10-CM

## 2014-08-15 DIAGNOSIS — E872 Acidosis: Secondary | ICD-10-CM | POA: Diagnosis present

## 2014-08-15 DIAGNOSIS — R532 Functional quadriplegia: Secondary | ICD-10-CM | POA: Diagnosis present

## 2014-08-15 DIAGNOSIS — E860 Dehydration: Secondary | ICD-10-CM | POA: Diagnosis present

## 2014-08-15 DIAGNOSIS — D65 Disseminated intravascular coagulation [defibrination syndrome]: Secondary | ICD-10-CM | POA: Diagnosis not present

## 2014-08-15 DIAGNOSIS — Z7401 Bed confinement status: Secondary | ICD-10-CM | POA: Diagnosis not present

## 2014-08-15 DIAGNOSIS — R4182 Altered mental status, unspecified: Secondary | ICD-10-CM | POA: Insufficient documentation

## 2014-08-15 DIAGNOSIS — Z87891 Personal history of nicotine dependence: Secondary | ICD-10-CM | POA: Diagnosis not present

## 2014-08-15 DIAGNOSIS — Z66 Do not resuscitate: Secondary | ICD-10-CM | POA: Diagnosis present

## 2014-08-15 DIAGNOSIS — Z515 Encounter for palliative care: Secondary | ICD-10-CM | POA: Diagnosis not present

## 2014-08-15 DIAGNOSIS — E119 Type 2 diabetes mellitus without complications: Secondary | ICD-10-CM | POA: Diagnosis present

## 2014-08-15 DIAGNOSIS — A419 Sepsis, unspecified organism: Principal | ICD-10-CM | POA: Diagnosis present

## 2014-08-15 DIAGNOSIS — E870 Hyperosmolality and hypernatremia: Secondary | ICD-10-CM | POA: Diagnosis present

## 2014-08-15 DIAGNOSIS — E039 Hypothyroidism, unspecified: Secondary | ICD-10-CM | POA: Diagnosis present

## 2014-08-15 DIAGNOSIS — E43 Unspecified severe protein-calorie malnutrition: Secondary | ICD-10-CM | POA: Diagnosis present

## 2014-08-15 DIAGNOSIS — G40909 Epilepsy, unspecified, not intractable, without status epilepticus: Secondary | ICD-10-CM | POA: Diagnosis present

## 2014-08-15 DIAGNOSIS — R0602 Shortness of breath: Secondary | ICD-10-CM

## 2014-08-15 DIAGNOSIS — K219 Gastro-esophageal reflux disease without esophagitis: Secondary | ICD-10-CM | POA: Diagnosis present

## 2014-08-15 DIAGNOSIS — J189 Pneumonia, unspecified organism: Secondary | ICD-10-CM | POA: Diagnosis present

## 2014-08-15 DIAGNOSIS — Z79899 Other long term (current) drug therapy: Secondary | ICD-10-CM | POA: Diagnosis not present

## 2014-08-15 DIAGNOSIS — Z9181 History of falling: Secondary | ICD-10-CM

## 2014-08-15 DIAGNOSIS — Y95 Nosocomial condition: Secondary | ICD-10-CM | POA: Diagnosis present

## 2014-08-15 DIAGNOSIS — R0902 Hypoxemia: Secondary | ICD-10-CM

## 2014-08-15 DIAGNOSIS — N189 Chronic kidney disease, unspecified: Secondary | ICD-10-CM

## 2014-08-15 DIAGNOSIS — R0600 Dyspnea, unspecified: Secondary | ICD-10-CM

## 2014-08-15 DIAGNOSIS — J9601 Acute respiratory failure with hypoxia: Secondary | ICD-10-CM | POA: Diagnosis not present

## 2014-08-15 DIAGNOSIS — F0391 Unspecified dementia with behavioral disturbance: Secondary | ICD-10-CM | POA: Diagnosis not present

## 2014-08-15 DIAGNOSIS — R404 Transient alteration of awareness: Secondary | ICD-10-CM

## 2014-08-15 DIAGNOSIS — N182 Chronic kidney disease, stage 2 (mild): Secondary | ICD-10-CM | POA: Diagnosis present

## 2014-08-15 DIAGNOSIS — I1310 Hypertensive heart and chronic kidney disease without heart failure, with stage 1 through stage 4 chronic kidney disease, or unspecified chronic kidney disease: Secondary | ICD-10-CM | POA: Diagnosis present

## 2014-08-15 DIAGNOSIS — N179 Acute kidney failure, unspecified: Secondary | ICD-10-CM | POA: Diagnosis present

## 2014-08-15 DIAGNOSIS — E876 Hypokalemia: Secondary | ICD-10-CM

## 2014-08-15 DIAGNOSIS — G9341 Metabolic encephalopathy: Secondary | ICD-10-CM | POA: Diagnosis present

## 2014-08-15 DIAGNOSIS — I248 Other forms of acute ischemic heart disease: Secondary | ICD-10-CM | POA: Diagnosis present

## 2014-08-15 DIAGNOSIS — R6521 Severe sepsis with septic shock: Secondary | ICD-10-CM | POA: Diagnosis present

## 2014-08-15 DIAGNOSIS — F0150 Vascular dementia without behavioral disturbance: Secondary | ICD-10-CM | POA: Diagnosis present

## 2014-08-15 DIAGNOSIS — R627 Adult failure to thrive: Secondary | ICD-10-CM | POA: Diagnosis present

## 2014-08-15 DIAGNOSIS — Z7189 Other specified counseling: Secondary | ICD-10-CM

## 2014-08-15 DIAGNOSIS — N390 Urinary tract infection, site not specified: Secondary | ICD-10-CM | POA: Diagnosis present

## 2014-08-15 DIAGNOSIS — D509 Iron deficiency anemia, unspecified: Secondary | ICD-10-CM | POA: Diagnosis present

## 2014-08-15 DIAGNOSIS — R296 Repeated falls: Secondary | ICD-10-CM | POA: Diagnosis present

## 2014-08-15 DIAGNOSIS — F0392 Unspecified dementia, unspecified severity, with psychotic disturbance: Secondary | ICD-10-CM | POA: Diagnosis present

## 2014-08-15 DIAGNOSIS — E869 Volume depletion, unspecified: Secondary | ICD-10-CM

## 2014-08-15 DIAGNOSIS — R509 Fever, unspecified: Secondary | ICD-10-CM

## 2014-08-15 LAB — URINALYSIS, ROUTINE W REFLEX MICROSCOPIC
GLUCOSE, UA: NEGATIVE mg/dL
GLUCOSE, UA: NEGATIVE mg/dL
KETONES UR: 15 mg/dL — AB
Ketones, ur: 15 mg/dL — AB
NITRITE: POSITIVE — AB
NITRITE: POSITIVE — AB
PH: 5 (ref 5.0–8.0)
PROTEIN: 30 mg/dL — AB
Protein, ur: 30 mg/dL — AB
Specific Gravity, Urine: 1.027 (ref 1.005–1.030)
Specific Gravity, Urine: 1.028 (ref 1.005–1.030)
Urobilinogen, UA: 4 mg/dL — ABNORMAL HIGH (ref 0.0–1.0)
Urobilinogen, UA: 4 mg/dL — ABNORMAL HIGH (ref 0.0–1.0)
pH: 5 (ref 5.0–8.0)

## 2014-08-15 LAB — BASIC METABOLIC PANEL
BUN: 67 mg/dL — ABNORMAL HIGH (ref 6–23)
CO2: 20 mmol/L (ref 19–32)
Calcium: 7.9 mg/dL — ABNORMAL LOW (ref 8.4–10.5)
Chloride: >130 mmol/L (ref 96–112)
Creatinine, Ser: 1.49 mg/dL — ABNORMAL HIGH (ref 0.50–1.35)
GFR calc Af Amer: 50 mL/min — ABNORMAL LOW (ref >90)
GFR, EST NON AFRICAN AMERICAN: 43 mL/min — AB (ref >90)
Glucose, Bld: 213 mg/dL — ABNORMAL HIGH (ref 70–99)
Potassium: 3.2 mmol/L — ABNORMAL LOW (ref 3.5–5.1)
SODIUM: 170 mmol/L — AB (ref 135–145)

## 2014-08-15 LAB — COMPREHENSIVE METABOLIC PANEL
ALBUMIN: 3.3 g/dL — AB (ref 3.5–5.2)
ALK PHOS: 212 U/L — AB (ref 39–117)
ALT: 68 U/L — ABNORMAL HIGH (ref 0–53)
AST: 86 U/L — ABNORMAL HIGH (ref 0–37)
BILIRUBIN TOTAL: 2.5 mg/dL — AB (ref 0.3–1.2)
BUN: 73 mg/dL — ABNORMAL HIGH (ref 6–23)
CO2: 25 mmol/L (ref 19–32)
Calcium: 9 mg/dL (ref 8.4–10.5)
Chloride: >130 mmol/L (ref 96–112)
Creatinine, Ser: 1.78 mg/dL — ABNORMAL HIGH (ref 0.50–1.35)
GFR, EST AFRICAN AMERICAN: 40 mL/min — AB (ref >90)
GFR, EST NON AFRICAN AMERICAN: 35 mL/min — AB (ref >90)
GLUCOSE: 208 mg/dL — AB (ref 70–99)
Potassium: 3 mmol/L — ABNORMAL LOW (ref 3.5–5.1)
SODIUM: 175 mmol/L — AB (ref 135–145)
TOTAL PROTEIN: 7.3 g/dL (ref 6.0–8.3)

## 2014-08-15 LAB — URINE MICROSCOPIC-ADD ON

## 2014-08-15 LAB — CBC WITH DIFFERENTIAL/PLATELET
BASOS ABS: 0 10*3/uL (ref 0.0–0.1)
BASOS PCT: 0 % (ref 0–1)
Eosinophils Absolute: 0 10*3/uL (ref 0.0–0.7)
Eosinophils Relative: 0 % (ref 0–5)
HEMATOCRIT: 41.6 % (ref 39.0–52.0)
HEMOGLOBIN: 12.8 g/dL — AB (ref 13.0–17.0)
Lymphocytes Relative: 20 % (ref 12–46)
Lymphs Abs: 2.1 10*3/uL (ref 0.7–4.0)
MCH: 31.4 pg (ref 26.0–34.0)
MCHC: 30.8 g/dL (ref 30.0–36.0)
MCV: 102.2 fL — ABNORMAL HIGH (ref 78.0–100.0)
MONO ABS: 0.7 10*3/uL (ref 0.1–1.0)
Monocytes Relative: 7 % (ref 3–12)
Neutro Abs: 7.7 10*3/uL (ref 1.7–7.7)
Neutrophils Relative %: 73 % (ref 43–77)
Platelets: 121 10*3/uL — ABNORMAL LOW (ref 150–400)
RBC: 4.07 MIL/uL — AB (ref 4.22–5.81)
RDW: 15.5 % (ref 11.5–15.5)
WBC: 10.5 10*3/uL (ref 4.0–10.5)

## 2014-08-15 LAB — MRSA PCR SCREENING: MRSA by PCR: NEGATIVE

## 2014-08-15 LAB — I-STAT CG4 LACTIC ACID, ED: Lactic Acid, Venous: 3.84 mmol/L (ref 0.5–2.0)

## 2014-08-15 LAB — PHOSPHORUS: Phosphorus: 2.2 mg/dL — ABNORMAL LOW (ref 2.3–4.6)

## 2014-08-15 LAB — LACTIC ACID, PLASMA
Lactic Acid, Venous: 2.2 mmol/L (ref 0.5–2.0)
Lactic Acid, Venous: 2.8 mmol/L (ref 0.5–2.0)

## 2014-08-15 LAB — MAGNESIUM: Magnesium: 2.1 mg/dL (ref 1.5–2.5)

## 2014-08-15 LAB — VALPROIC ACID LEVEL

## 2014-08-15 LAB — TROPONIN I: TROPONIN I: 0.14 ng/mL — AB (ref <0.031)

## 2014-08-15 MED ORDER — SODIUM CHLORIDE 0.9 % IV BOLUS (SEPSIS)
500.0000 mL | Freq: Once | INTRAVENOUS | Status: AC
  Administered 2014-08-16: 500 mL via INTRAVENOUS

## 2014-08-15 MED ORDER — LORAZEPAM 0.5 MG PO TABS: ORAL_TABLET | ORAL | Status: AC

## 2014-08-15 MED ORDER — LEVETIRACETAM IN NACL 750 MG/50ML IV SOLN
750.0000 mg | Freq: Two times a day (BID) | INTRAVENOUS | Status: DC
  Administered 2014-08-15 – 2014-08-16 (×2): 750 mg via INTRAVENOUS
  Filled 2014-08-15 (×3): qty 50

## 2014-08-15 MED ORDER — LEVOTHYROXINE SODIUM 100 MCG IV SOLR
87.5000 ug | Freq: Every day | INTRAVENOUS | Status: DC
  Administered 2014-08-15: 87.5 ug via INTRAVENOUS
  Filled 2014-08-15: qty 5

## 2014-08-15 MED ORDER — VALPROATE SODIUM 500 MG/5ML IV SOLN
500.0000 mg | Freq: Two times a day (BID) | INTRAVENOUS | Status: DC
  Administered 2014-08-15 – 2014-08-21 (×12): 500 mg via INTRAVENOUS
  Filled 2014-08-15 (×12): qty 5

## 2014-08-15 MED ORDER — POTASSIUM CHLORIDE 10 MEQ/100ML IV SOLN
10.0000 meq | Freq: Once | INTRAVENOUS | Status: AC
  Administered 2014-08-15: 10 meq via INTRAVENOUS
  Filled 2014-08-15: qty 100

## 2014-08-15 MED ORDER — PIPERACILLIN-TAZOBACTAM 3.375 G IVPB
3.3750 g | Freq: Three times a day (TID) | INTRAVENOUS | Status: DC
  Administered 2014-08-15 – 2014-08-21 (×17): 3.375 g via INTRAVENOUS
  Filled 2014-08-15 (×10): qty 50

## 2014-08-15 MED ORDER — VALPROATE SODIUM 500 MG/5ML IV SOLN: 250.0000 mg | Freq: Two times a day (BID) | INTRAVENOUS | Status: DC

## 2014-08-15 MED ORDER — SODIUM CHLORIDE 0.9 % IV SOLN: INTRAVENOUS | Status: DC

## 2014-08-15 MED ORDER — CHLORHEXIDINE GLUCONATE 0.12 % MT SOLN
15.0000 mL | Freq: Two times a day (BID) | OROMUCOSAL | Status: DC
  Administered 2014-08-15 – 2014-08-20 (×9): 15 mL via OROMUCOSAL
  Filled 2014-08-15 (×10): qty 15

## 2014-08-15 MED ORDER — POTASSIUM CHLORIDE 10 MEQ/100ML IV SOLN
10.0000 meq | INTRAVENOUS | Status: AC
  Administered 2014-08-15 (×4): 10 meq via INTRAVENOUS
  Filled 2014-08-15 (×4): qty 100

## 2014-08-15 MED ORDER — LEVOFLOXACIN IN D5W 750 MG/150ML IV SOLN
750.0000 mg | Freq: Once | INTRAVENOUS | Status: AC
  Administered 2014-08-15: 750 mg via INTRAVENOUS
  Filled 2014-08-15: qty 150

## 2014-08-15 MED ORDER — ONDANSETRON HCL 4 MG/2ML IJ SOLN: 4.0000 mg | Freq: Four times a day (QID) | INTRAMUSCULAR | Status: DC | PRN

## 2014-08-15 MED ORDER — SODIUM CHLORIDE 0.9 % IV BOLUS (SEPSIS)
1000.0000 mL | Freq: Once | INTRAVENOUS | Status: AC
  Administered 2014-08-15: 1000 mL via INTRAVENOUS

## 2014-08-15 MED ORDER — LEVOTHYROXINE SODIUM 100 MCG IV SOLR: 75.0000 ug | Freq: Every day | INTRAVENOUS | Status: DC

## 2014-08-15 MED ORDER — ONDANSETRON HCL 4 MG PO TABS: 4.0000 mg | ORAL_TABLET | Freq: Four times a day (QID) | ORAL | Status: DC | PRN

## 2014-08-15 MED ORDER — SODIUM CHLORIDE 0.9 % IV SOLN: 1000.0000 mL | INTRAVENOUS | Status: DC

## 2014-08-15 MED ORDER — SODIUM CHLORIDE 0.9 % IV SOLN
1000.0000 mL | Freq: Once | INTRAVENOUS | Status: AC
  Administered 2014-08-15: 1000 mL via INTRAVENOUS

## 2014-08-15 MED ORDER — CETYLPYRIDINIUM CHLORIDE 0.05 % MT LIQD
7.0000 mL | Freq: Two times a day (BID) | OROMUCOSAL | Status: DC
  Administered 2014-08-16 – 2014-08-20 (×8): 7 mL via OROMUCOSAL

## 2014-08-15 MED ORDER — POTASSIUM CHLORIDE 10 MEQ/100ML IV SOLN: 10.0000 meq | INTRAVENOUS | Status: DC

## 2014-08-15 MED ORDER — ACETAMINOPHEN 650 MG RE SUPP
650.0000 mg | Freq: Once | RECTAL | Status: AC
  Administered 2014-08-15: 650 mg via RECTAL
  Filled 2014-08-15: qty 1

## 2014-08-15 MED ORDER — ENOXAPARIN SODIUM 30 MG/0.3ML ~~LOC~~ SOLN
30.0000 mg | SUBCUTANEOUS | Status: DC
  Administered 2014-08-15 – 2014-08-16 (×2): 30 mg via SUBCUTANEOUS
  Filled 2014-08-15 (×2): qty 0.3

## 2014-08-15 MED ORDER — VANCOMYCIN HCL IN DEXTROSE 750-5 MG/150ML-% IV SOLN
750.0000 mg | INTRAVENOUS | Status: DC
  Administered 2014-08-15 – 2014-08-16 (×2): 750 mg via INTRAVENOUS
  Filled 2014-08-15 (×2): qty 150

## 2014-08-15 MED ORDER — DEXTROSE 5 % IV SOLN
2.0000 g | Freq: Once | INTRAVENOUS | Status: AC
  Administered 2014-08-15: 2 g via INTRAVENOUS
  Filled 2014-08-15: qty 2

## 2014-08-15 MED ORDER — SODIUM CHLORIDE 0.9 % IV BOLUS (SEPSIS)
500.0000 mL | Freq: Once | INTRAVENOUS | Status: AC
  Administered 2014-08-15: 500 mL via INTRAVENOUS

## 2014-08-15 MED ORDER — ENOXAPARIN SODIUM 40 MG/0.4ML ~~LOC~~ SOLN: 40.0000 mg | SUBCUTANEOUS | Status: DC

## 2014-08-15 NOTE — Progress Notes (Signed)
ANTIBIOTIC CONSULT NOTE - INITIAL  Pharmacy Consult for Vancomycin and Zosyn Indication: rule out sepsis  Allergies  Allergen Reactions  . Biaxin [Clarithromycin]     Unknown reaction    Patient Measurements: Height: 5\' 7"  (170.2 cm) Weight: 126 lb 15.8 oz (57.6 kg) IBW/kg (Calculated) : 66.1  Vital Signs: Temp: 99.7 F (37.6 C) (02/08 1600) Temp Source: Rectal (02/08 1600) BP: 104/42 mmHg (02/08 1820) Pulse Rate: 106 (02/08 1820) Intake/Output from previous day:   Intake/Output from this shift: Total I/O In: -  Out: 75 [Urine:75]  Labs:  Recent Labs  04-07-15 1353  WBC 10.5  HGB 12.8*  PLT 121*  CREATININE 1.78*   Estimated Creatinine Clearance: 27.9 mL/min (by C-G formula based on Cr of 1.78). No results for input(s): VANCOTROUGH, VANCOPEAK, VANCORANDOM, GENTTROUGH, GENTPEAK, GENTRANDOM, TOBRATROUGH, TOBRAPEAK, TOBRARND, AMIKACINPEAK, AMIKACINTROU, AMIKACIN in the last 72 hours.   Microbiology: No results found for this or any previous visit (from the past 720 hour(s)).  Medical History: Past Medical History  Diagnosis Date  . Subdural hematoma, post-traumatic   . Chronic indwelling Foley catheter   . Dementia with aggressive behavior   . Frequent falls   . H/O alcohol abuse   . DM (diabetes mellitus)   . HTN (hypertension)   . Sinus tachycardia   . Seizures   . GERD (gastroesophageal reflux disease)     Medications:  Anti-infectives    Start     Dose/Rate Route Frequency Ordered Stop   04-07-15 1530  levofloxacin (LEVAQUIN) IVPB 750 mg     750 mg100 mL/hr over 90 Minutes Intravenous  Once 04-07-15 1522 04-07-15 1720   04-07-15 1530  aztreonam (AZACTAM) 2 g in dextrose 5 % 50 mL IVPB     2 g100 mL/hr over 30 Minutes Intravenous  Once 04-07-15 1522 04-07-15 1808     Assessment: 80yo M from a nursing home w/ lethargy and fever. Given single doses of aztreonam and Levaquin in the ED. Pharmacy is asked to dose Vanc and Zosyn for sepsis.   2/8 >>  Zosyn >> 2/8 >> Vanc >>    Tmax: 101 WBCs: wnl Renal: CKD stage II, SCr 1.78 on admission(unsure of baseline), CrCl 28CG, 35N   Goal of Therapy:  Vancomycin trough level 15-20 mcg/ml  Appropriate antibiotic dosing for renal function; eradication of infection  Plan:  Vancomycin 750mg  IV q24h. Zosyn 3.375g IV Q8H infused over 4hrs. Measure Vanc trough at steady state. Follow up renal fxn, culture results, and clinical course.  Charolotte Ekeom Shawntee Mainwaring, PharmD, pager [phone]. 08/13/2014,6:31 PM.

## 2014-08-15 NOTE — ED Notes (Signed)
Patient transported to X-ray 

## 2014-08-15 NOTE — ED Notes (Signed)
Attempt to draw 2nd set of cultures unsuccessful. Mary from main lab reports will attempt 2nd set of cultures.

## 2014-08-15 NOTE — ED Notes (Signed)
Bed: RESB Expected date:  Expected time:  Means of arrival:  Comments: Elderly AMS/fever

## 2014-08-15 NOTE — Progress Notes (Signed)
CRITICAL VALUE ALERT  Critical value received:  Lactic acid 2.8  Date of notification:  08/20/2014  Time of notification:  2330  Critical value read back:Yes.    Nurse who received alert:  Drue Stageraroline Alejo Beamer  MD notified (1st page):  Schorr  Time of first page:  2340  MD notified (2nd page):  Time of second page:  Responding MD:  Schorr  Time MD responded:  [phone]

## 2014-08-15 NOTE — Progress Notes (Signed)
CRITICAL VALUE ALERT  Critical value received:  Na 170, chloride >130 Date of notification:  08/27/2014  Time of notification:  2016  Critical value read back:Yes.    Nurse who received alert:  Drue Stageraroline Pierson Vantol  MD notified (1st page):  08/20/2014  Time of first page:  2016  MD notified (2nd page):08/13/2014  Time of second page:2036  Responding MD:  Schorr  Time MD responded: 2040

## 2014-08-15 NOTE — ED Provider Notes (Signed)
CSN: [id number]     Arrival date & time 08/29/2014  1341 History   First MD Initiated Contact with Patient 11-21-14 1404     Chief Complaint  Patient presents with  . Lethargic   . Fever   Level 5 caveat   (Consider location/radiation/quality/duration/timing/severity/associated sxs/prior Treatment) HPI 80 year old male from nursing home with history of severe dementia. They report that he has had decreased responsiveness for several days. They have noted that he has also been having increased heart rate nurse reported an episode of decreased oxygen saturation. Otherwise history is obtained from his records. Past Medical History  Diagnosis Date  . Subdural hematoma, post-traumatic   . Chronic indwelling Foley catheter   . Dementia with aggressive behavior   . Frequent falls   . H/O alcohol abuse   . DM (diabetes mellitus)   . HTN (hypertension)   . Sinus tachycardia   . Seizures   . GERD (gastroesophageal reflux disease)    History reviewed. No pertinent past surgical history. No family history on file. History  Substance Use Topics  . Smoking status: Former Games developermoker  . Smokeless tobacco: Not on file  . Alcohol Use: No    Review of Systems  Unable to perform ROS     Allergies  Biaxin  Home Medications   Prior to Admission medications   Medication Sig Start Date End Date Taking? Authorizing Provider  acetaminophen (TYLENOL) 325 MG tablet 650 mg by PEG Tube route every 8 (eight) hours as needed. For fever/pain   Yes Historical Provider, MD  AMBULATORY NON FORMULARY MEDICATION Medication Name: Lorazepam 0.5 mg/ml gel [id number]:Apply 1ml topically to skin twice daily routinely for agitation; Apply 1ml topically to skin every 6 hours as needed for agitation 08/17/13  Yes Kimber RelicArthur G Green, MD  carvedilol (COREG) 12.5 MG tablet 12.5 mg by PEG Tube route 2 (two) times daily with a meal.   Yes Historical Provider, MD  cefTRIAXone 1 g in dextrose 5 % 50 mL Inject 1 g into the vein daily.  08/14/14 08/16/14 Yes Historical Provider, MD  cholecalciferol (VITAMIN D) 1000 UNITS tablet Take 1,000 Units by mouth daily.   Yes Historical Provider, MD  dextrose (GLUTOSE) 40 % GEL Take 1 Tube by mouth once as needed for low blood sugar.   Yes Historical Provider, MD  divalproex (DEPAKOTE SPRINKLE) 125 MG capsule Take 500 mg by mouth 2 (two) times daily.    Yes Historical Provider, MD  docusate (COLACE) 60 MG/15ML syrup Take 100 mg by mouth 2 (two) times daily.   Yes Historical Provider, MD  famotidine (PEPCID) 20 MG tablet Take 1 tablet (20 mg total) by mouth daily. 07/19/13  Yes Sharee Holstereborah S Green, NP  levETIRAcetam (KEPPRA) 750 MG tablet Take 750 mg by mouth every 12 (twelve) hours.   Yes Historical Provider, MD  levothyroxine (SYNTHROID) 175 MCG tablet Take 1 tablet (175 mcg total) by mouth daily before breakfast. 01/05/14  Yes Sharee Holstereborah S Green, NP  lidocaine (XYLOCAINE) 4 % external solution Apply 20 mLs topically daily. 08/14/14 08/16/14 Yes Historical Provider, MD  LORazepam (ATIVAN) 0.5 MG tablet Take 1/2 tablet by mouth twice daily for anxiety. Hold if sedated. 08/24/2014  Yes Sharon SellerJessica K Eubanks, NP  LORazepam (ATIVAN) 2 MG/ML injection Inject 0.445ml intramuscularly as needed for agitation; May repeat 0.35ml intramuscularly in 30 minutes if agitation continues; Inject 0.905ml intramuscularly every shift if resident is combative or violent 12/20/13  Yes Tiffany L Reed, DO  miconazole (MICOTIN) 2 % cream Apply  1 application topically 2 (two) times daily as needed (for scrotum/penis).   Yes Historical Provider, MD  omeprazole (PRILOSEC) 20 MG capsule Take 20 mg by mouth daily.   Yes Historical Provider, MD  QUEtiapine (SEROQUEL) 25 MG tablet Take 75 mg by mouth 2 (two) times daily.   Yes Historical Provider, MD  rivastigmine (EXELON) 9.5 mg/24hr Place 9.5 mg onto the skin daily. For dementia   Yes Historical Provider, MD  sertraline (ZOLOFT) 25 MG tablet Take 75 mg by mouth daily. For depression and anxiety   Yes  Historical Provider, MD  thiamine (VITAMIN B-1) 100 MG tablet Take 1 tablet (100 mg total) by mouth daily. 07/19/13  Yes Sharee Holster, NP   BP 111/57 mmHg  Pulse 90  Temp(Src) 101 F (38.3 C) (Rectal)  Resp 18  SpO2 99% Physical Exam  Constitutional: He appears well-developed.  HENT:  Head: Normocephalic and atraumatic.  Right Ear: External ear normal.  Left Ear: External ear normal.  dry  Eyes: Conjunctivae and EOM are normal. Pupils are equal, round, and reactive to light.  Neck: Normal range of motion. Neck supple.  Cardiovascular: Normal rate, regular rhythm and intact distal pulses.   Murmur heard. Pulmonary/Chest: He has rales.  Abdominal: Soft. Bowel sounds are normal.  Musculoskeletal: Normal range of motion.  Nursing note and vitals reviewed.   ED Course  Procedures (including critical care time) Labs Review Labs Reviewed  CBC WITH DIFFERENTIAL/PLATELET - Abnormal; Notable for the following:    RBC 4.07 (*)    Hemoglobin 12.8 (*)    MCV 102.2 (*)    Platelets 121 (*)    All other components within normal limits  COMPREHENSIVE METABOLIC PANEL - Abnormal; Notable for the following:    Sodium 175 (*)    Potassium 3.0 (*)    Chloride >130 (*)    Glucose, Bld 208 (*)    BUN 73 (*)    Creatinine, Ser 1.78 (*)    Albumin 3.3 (*)    AST 86 (*)    ALT 68 (*)    Alkaline Phosphatase 212 (*)    Total Bilirubin 2.5 (*)    GFR calc non Af Amer 35 (*)    GFR calc Af Amer 40 (*)    All other components within normal limits  URINALYSIS, ROUTINE W REFLEX MICROSCOPIC - Abnormal; Notable for the following:    Color, Urine ORANGE (*)    APPearance CLOUDY (*)    Hgb urine dipstick MODERATE (*)    Bilirubin Urine LARGE (*)    Ketones, ur 15 (*)    Protein, ur 30 (*)    Urobilinogen, UA 4.0 (*)    Nitrite POSITIVE (*)    Leukocytes, UA SMALL (*)    All other components within normal limits  URINE MICROSCOPIC-ADD ON - Abnormal; Notable for the following:    Bacteria,  UA FEW (*)    Casts HYALINE CASTS (*)    All other components within normal limits  I-STAT CG4 LACTIC ACID, ED - Abnormal; Notable for the following:    Lactic Acid, Venous 3.84 (*)    All other components within normal limits  CULTURE, BLOOD (ROUTINE X 2)  CULTURE, BLOOD (ROUTINE X 2)  URINE CULTURE    Imaging Review Ct Head Wo Contrast  Aug 16, 2014   CLINICAL DATA:  Altered mental status.  EXAM: CT HEAD WITHOUT CONTRAST  TECHNIQUE: Contiguous axial images were obtained from the base of the skull through the vertex without intravenous  contrast.  COMPARISON:  Head CT scan 10/18/2012.  FINDINGS: Atrophy and chronic microvascular ischemic change are stable in appearance. No evidence of acute abnormality including hemorrhage, infarct, mass lesion, mass effect, midline shift or abnormal extra-axial fluid collection is identified. No hydrocephalus or pneumocephalus. The calvarium is intact with burr holes on the right noted.  IMPRESSION: No acute abnormality.  Atrophy and chronic microvascular ischemic change.   Electronically Signed   By: Drusilla Kanner M.D.   On: 08/26/2014 15:06   Dg Chest Port 1 View  08/28/2014   CLINICAL DATA:  Fever and hypertension  EXAM: PORTABLE CHEST - 1 VIEW  COMPARISON:  December 13, 2012  FINDINGS: There is no edema or consolidation. Heart size and pulmonary vascularity are normal. No adenopathy. No bone lesions.  IMPRESSION: No edema or consolidation.   Electronically Signed   By: Bretta Bang M.D.   On: 08/25/2014 14:45     EKG Interpretation   Date/Time:  Monday August 15 2014 15:38:46 EST Ventricular Rate:  82 PR Interval:  99 QRS Duration: 81 QT Interval:  369 QTC Calculation: 431 R Axis:   43 Text Interpretation:  Sinus rhythm Short PR interval Abnormal R-wave  progression, early transition Confirmed by Mao Lockner MD, Duwayne Heck [phone]) on  09/01/2014 3:41:59 PM      MDM   Final diagnoses:  Altered mental state  Hypernatremia  Volume depletion   Hypokalemia  UTI (lower urinary tract infection)  Sepsis, due to unspecified organism    Patient with baseline dementia but reported as full code.  Patient presents with fever, ams, and severe electrolyte disturbance, uti.  Patient being treated with iv fluids, antibiotics, and potassium.  Plan admission for further treatment.     Hilario Quarry, MD 08/12/2014 [phone]

## 2014-08-15 NOTE — ED Notes (Signed)
Main lab at bedside 

## 2014-08-15 NOTE — Telephone Encounter (Signed)
[facility] 

## 2014-08-15 NOTE — H&P (Signed)
[facility] History and Physical  Ricardo [name] ZOX:[id number] DOB: 1935-03-01 DOA: 08/20/2014  Referring physician: ED physician PCP: Ricardo GroutPANDEY, [name], MD   Chief Complaint: AMS  HPI:  Pt is 80 yo male from SNF, with severe dementia and limited mobility, mostly bed bound per reports, [name], [facility] after noted to be minimally responsive and with poor oral intake for several days. Pt is unable to provide any history due to acute illness and underlying dementia and most of the details obtained from available records and ED doctor. Pt has had several episodes of hypoxia but no clear documentation of how low oxygen sat's were.   In ED, pt is lethargic, severity contracted in upper and lower extremities, difficult to arouse even with noxious stimuli. VS notable for Tmax 101 F, BP 89/54, RR 22 bpm, oxygen saturation 89 % on RA. Blood work notable for severe hypernatremia > 175. [facility] asked to admit to ICU for further evaluation.   Assessment and Plan: Active Problems: Severe sepsis - appears to be urinary source based on UA - admit to ICU - start with providing supportive care with IVF, broad spectrum ABX Vancomycin and Zosyn and narrow down as clinically indicated - monitor electrolytes closely - repeat BMP and lactic acid again tonight and in AM Severe electrolyte disturbances - hypernatremia, hypokalemia - IVF to be provided, supplement potassium via IV route - repeat BMP tonight  Acute renal failure - secondary to sepsis, decreased effective circulatory volume  - IVF as noted above and repeat BMP  Acute encephalopathy, metabolic and imposed on severe dementia - hold any sedating medications for now that pt was taking at SNF including Ativan  Anemia of chronic iron deficiency, thrombocytopenia  - Hg slightly above typical baseline of 10 -11, likely from hemoconcentration and dehydration - Plt slightly low but  no signs of active bleeding  - IVF as noted above  Severe dementia, FTT, severe PCM, functional quadriplegia - keep NPO, supportive care for now and reassess in AM - very poor functional prognosis - GOC in discussion with legal guardian  Seizures - continue Keppra and Depakote IV route Hypothyroidism - continue synthroid IV route  Transaminitis - repeat CMET in AM  Radiological Exams on Admission: Ct Head Wo Contrast  08/26/2014  No acute abnormality.  Atrophy and chronic microvascular ischemic change.   Dg Chest Port 1 View  08/14/2014   No edema or consolidation.     Code Status: Full Family Communication: no family or legal guardian at bedside. Got in touch with Miss [name], explained critical nature of the pt's illness and the fact that intensive measures that include CPR and intubation would impose unnecessary harm to the pt. She has explained to me he is full code per their records and she is to email me document which needs to be filled out by attending and non attending physician and after that this will go to their supervisor for continuation of discussion.  Disposition Plan: Admit for further evaluation    [name] [phone]  Review of Systems:  Unable to obtain due to altered mental status     Past Medical History  Diagnosis Date  . Subdural hematoma, post-traumatic   . Chronic indwelling Foley catheter   . Dementia with aggressive behavior   . Frequent falls   . H/O alcohol abuse   . DM (diabetes mellitus)   . HTN (hypertension)   . Sinus tachycardia   .  Seizures   . GERD (gastroesophageal reflux disease)      Social History:  reports that he has quit smoking. He does not have any smokeless tobacco history on file. He reports that he does not drink alcohol or use illicit drugs.  Allergies  Allergen Reactions  . Biaxin [Clarithromycin]     Unknown reaction     Unable to obtain family medical history due to altered mental status   Prior to  Admission medications   Medication Sig Start Date End Date Taking? Authorizing Provider  acetaminophen (TYLENOL) 325 MG tablet 650 mg by PEG Tube route every 8 (eight) hours as needed. For fever/pain   Yes Historical Provider, MD  AMBULATORY NON FORMULARY MEDICATION Medication Name: Lorazepam 0.5 mg/ml gel [id number] 1ml topically to skin twice daily routinely for agitation; Apply 1ml topically to skin every 6 hours as needed for agitation 08/17/13  Yes Ricardo Relic, MD  carvedilol (COREG) 12.5 MG tablet 12.5 mg by PEG Tube route 2 (two) times daily with a meal.   Yes Historical Provider, MD  cefTRIAXone 1 g in dextrose 5 % 50 mL Inject 1 g into the vein daily. 08/14/14 08/16/14 Yes Historical Provider, MD  cholecalciferol (VITAMIN D) 1000 UNITS tablet Take 1,000 Units by mouth daily.   Yes Historical Provider, MD  dextrose (GLUTOSE) 40 % GEL Take 1 Tube by mouth once as needed for low blood sugar.   Yes Historical Provider, MD  divalproex (DEPAKOTE SPRINKLE) 125 MG capsule Take 500 mg by mouth 2 (two) times daily.    Yes Historical Provider, MD  docusate (COLACE) 60 MG/15ML syrup Take 100 mg by mouth 2 (two) times daily.   Yes Historical Provider, MD  famotidine (PEPCID) 20 MG tablet Take 1 tablet (20 mg total) by mouth daily. 07/19/13  Yes Ricardo Holster, NP  levETIRAcetam (KEPPRA) 750 MG tablet Take 750 mg by mouth every 12 (twelve) hours.   Yes Historical Provider, MD  levothyroxine (SYNTHROID) 175 MCG tablet Take 1 tablet (175 mcg total) by mouth daily before breakfast. 01/05/14  Yes Ricardo Holster, NP  lidocaine (XYLOCAINE) 4 % external solution Apply 20 mLs topically daily. 08/14/14 08/16/14 Yes Historical Provider, MD  LORazepam (ATIVAN) 0.5 MG tablet Take 1/2 tablet by mouth twice daily for anxiety. Hold if sedated. 08/17/2014  Yes Ricardo Seller, NP  LORazepam (ATIVAN) 2 MG/ML injection Inject 0.38ml intramuscularly as needed for agitation; May repeat 0.5ml intramuscularly in 30 minutes if agitation  continues; Inject 0.110ml intramuscularly every shift if resident is combative or violent 12/20/13  Yes Tiffany L Reed, DO  miconazole (MICOTIN) 2 % cream Apply 1 application topically 2 (two) times daily as needed (for scrotum/penis).   Yes Historical Provider, MD  omeprazole (PRILOSEC) 20 MG capsule Take 20 mg by mouth daily.   Yes Historical Provider, MD  QUEtiapine (SEROQUEL) 25 MG tablet Take 75 mg by mouth 2 (two) times daily.   Yes Historical Provider, MD  rivastigmine (EXELON) 9.5 mg/24hr Place 9.5 mg onto the skin daily. For dementia   Yes Historical Provider, MD  sertraline (ZOLOFT) 25 MG tablet Take 75 mg by mouth daily. For depression and anxiety   Yes Historical Provider, MD  thiamine (VITAMIN B-1) 100 MG tablet Take 1 tablet (100 mg total) by mouth daily. 07/19/13  Yes Ricardo Holster, NP    Physical Exam: Filed Vitals:   09/04/2014 1508 08/14/2014 1530 08/13/2014 1545 08/12/2014 1600  BP: 111/54  110/70 111/56  Pulse:  82 78  Temp:    99.7 F (37.6 C)  TempSrc:    Rectal  Resp: 20  22   Weight:  66.679 kg (147 lb)    SpO2: 95%  96% 97%    Physical Exam  Constitutional: Appears lethargic, frail and chronically ill HENT: Normocephalic. External right and left ear normal. Dry MM Eyes: PERRLA, no scleral icterus.  Neck: Neck supple. No JVD. No tracheal deviation. No thyromegaly.  CVS: RRR, S1/S2 +, no gallops, no carotid bruit.  Pulmonary: Effort and breath sounds normal, diminished breath sounds at bases Abdominal: Soft. BS +,  no distension, tenderness, rebound or guarding.  Musculoskeletal: No edema and no tenderness. contracted upper and lower extremities  Lymphadenopathy: No lymphadenopathy noted, cervical, inguinal. Neuro: Lethargic, minimally withdraws to sternal rub and moans Skin: Not diaphoretic. No erythema. Poor turgor  Psychiatric: Difficult to assess due to altered mental status   Labs on Admission:  Basic Metabolic Panel:  Recent Labs Lab August 19, 2014 1353  NA  175*  K 3.0*  CL >130*  CO2 25  GLUCOSE 208*  BUN 73*  CREATININE 1.78*  CALCIUM 9.0   Liver Function Tests:  Recent Labs Lab 08/19/2014 1353  AST 86*  ALT 68*  ALKPHOS 212*  BILITOT 2.5*  PROT 7.3  ALBUMIN 3.3*   CBC:  Recent Labs Lab 08/19/14 1353  WBC 10.5  NEUTROABS 7.7  HGB 12.8*  HCT 41.6  MCV 102.2*  PLT 121*   EKG: Normal sinus rhythm, no ST/T wave changes   If 7PM-7AM, please contact night-coverage [email or web address] Password [facility] 19-Aug-2014, 4:04 PM

## 2014-08-15 NOTE — ED Notes (Signed)
[phone] Dontay/Guardian   Extended line after hours (NOT 8a-5p) [phone]

## 2014-08-15 NOTE — ED Notes (Signed)
Pt transported to CT ?

## 2014-08-15 NOTE — ED Notes (Addendum)
[facility] with complaint of "tachycardia, decrease alertness, inability to swallow, and decrease oxygen saturation." Facility reports rectal temperature of 99 F and pt less combative than the normal.

## 2014-08-15 NOTE — Progress Notes (Signed)
CRITICAL VALUE ALERT  Critical value received:  Lactic Acid 2.2  Date of notification: 08/14/2014  Time of notification: 2040  Critical value read back:Yes.    Nurse who received alert:  Drue Stageraroline Allanna Bresee  MD notified (1st page):  08/14/2014  Time of first page:  2040  MD notified (2nd page):  Time of second page:  Responding MD:  Schorr  Time MD responded:  2040

## 2014-08-15 NOTE — ED Notes (Signed)
DG at bedside. 

## 2014-08-15 NOTE — ED Notes (Signed)
Spoke with Meriam SpragueBeverly from facility reports EMS reports summary accurate. Continues to reports RA oxygen saturation 76% prior to EMS arrival.

## 2014-08-16 ENCOUNTER — Inpatient Hospital Stay (HOSPITAL_COMMUNITY): Payer: Medicare Other

## 2014-08-16 LAB — COMPREHENSIVE METABOLIC PANEL
ALT: 54 U/L — ABNORMAL HIGH (ref 0–53)
AST: 82 U/L — ABNORMAL HIGH (ref 0–37)
Albumin: 2.4 g/dL — ABNORMAL LOW (ref 3.5–5.2)
Alkaline Phosphatase: 155 U/L — ABNORMAL HIGH (ref 39–117)
BUN: 55 mg/dL — AB (ref 6–23)
CO2: 18 mmol/L — AB (ref 19–32)
CREATININE: 1.49 mg/dL — AB (ref 0.50–1.35)
Calcium: 7.7 mg/dL — ABNORMAL LOW (ref 8.4–10.5)
Chloride: >130 mmol/L (ref 96–112)
GFR, EST AFRICAN AMERICAN: 50 mL/min — AB (ref >90)
GFR, EST NON AFRICAN AMERICAN: 43 mL/min — AB (ref >90)
GLUCOSE: 128 mg/dL — AB (ref 70–99)
Potassium: 4.6 mmol/L (ref 3.5–5.1)
Sodium: 174 mmol/L (ref 135–145)
Total Bilirubin: 2.5 mg/dL — ABNORMAL HIGH (ref 0.3–1.2)
Total Protein: 5.7 g/dL — ABNORMAL LOW (ref 6.0–8.3)

## 2014-08-16 LAB — BASIC METABOLIC PANEL
BUN: 52 mg/dL — ABNORMAL HIGH (ref 6–23)
BUN: 51 mg/dL — ABNORMAL HIGH (ref 6–23)
CALCIUM: 7.9 mg/dL — AB (ref 8.4–10.5)
CALCIUM: 7.8 mg/dL — AB (ref 8.4–10.5)
CO2: 16 mmol/L — ABNORMAL LOW (ref 19–32)
CO2: 17 mmol/L — ABNORMAL LOW (ref 19–32)
CREATININE: 1.57 mg/dL — AB (ref 0.50–1.35)
Chloride: >130 mmol/L (ref 96–112)
Creatinine, Ser: 1.61 mg/dL — ABNORMAL HIGH (ref 0.50–1.35)
GFR calc Af Amer: 46 mL/min — ABNORMAL LOW (ref >90)
GFR, EST AFRICAN AMERICAN: 47 mL/min — AB (ref >90)
GFR, EST NON AFRICAN AMERICAN: 39 mL/min — AB (ref >90)
GFR, EST NON AFRICAN AMERICAN: 41 mL/min — AB (ref >90)
Glucose, Bld: 116 mg/dL — ABNORMAL HIGH (ref 70–99)
Glucose, Bld: 190 mg/dL — ABNORMAL HIGH (ref 70–99)
POTASSIUM: 3.1 mmol/L — AB (ref 3.5–5.1)
Potassium: 4.3 mmol/L (ref 3.5–5.1)
SODIUM: 170 mmol/L — AB (ref 135–145)
Sodium: 174 mmol/L (ref 135–145)

## 2014-08-16 LAB — CBC
HCT: 34.8 % — ABNORMAL LOW (ref 39.0–52.0)
Hemoglobin: 11.1 g/dL — ABNORMAL LOW (ref 13.0–17.0)
MCH: 32.3 pg (ref 26.0–34.0)
MCHC: 31.9 g/dL (ref 30.0–36.0)
MCV: 101.2 fL — AB (ref 78.0–100.0)
Platelets: 106 10*3/uL — ABNORMAL LOW (ref 150–400)
RBC: 3.44 MIL/uL — AB (ref 4.22–5.81)
RDW: 15.6 % — ABNORMAL HIGH (ref 11.5–15.5)
WBC: 8 10*3/uL (ref 4.0–10.5)

## 2014-08-16 LAB — URINE CULTURE
COLONY COUNT: NO GROWTH
Culture: NO GROWTH

## 2014-08-16 LAB — HEMOGLOBIN A1C
Hgb A1c MFr Bld: 6.6 % — ABNORMAL HIGH (ref 4.8–5.6)
Mean Plasma Glucose: 143 mg/dL

## 2014-08-16 LAB — BLOOD GAS, ARTERIAL
ACID-BASE DEFICIT: 5.1 mmol/L — AB (ref 0.0–2.0)
Bicarbonate: 16 mEq/L — ABNORMAL LOW (ref 20.0–24.0)
DRAWN BY: 331471
O2 Content: 5 L/min
O2 Saturation: 85.2 %
Patient temperature: 98.6
TCO2: 14.2 mmol/L (ref 0–100)
pCO2 arterial: 20.6 mmHg — ABNORMAL LOW (ref 35.0–45.0)
pH, Arterial: 7.503 — ABNORMAL HIGH (ref 7.350–7.450)
pO2, Arterial: 46.1 mmHg — ABNORMAL LOW (ref 80.0–100.0)

## 2014-08-16 LAB — TROPONIN I
Troponin I: 0.15 ng/mL — ABNORMAL HIGH (ref <0.031)
Troponin I: 0.16 ng/mL — ABNORMAL HIGH (ref <0.031)

## 2014-08-16 LAB — GLUCOSE, CAPILLARY
GLUCOSE-CAPILLARY: 168 mg/dL — AB (ref 70–99)
GLUCOSE-CAPILLARY: 190 mg/dL — AB (ref 70–99)

## 2014-08-16 LAB — TSH: TSH: 0.23 u[IU]/mL — ABNORMAL LOW (ref 0.350–4.500)

## 2014-08-16 MED ORDER — POTASSIUM CHLORIDE 2 MEQ/ML IV SOLN
INTRAVENOUS | Status: DC
  Administered 2014-08-16 – 2014-08-20 (×4): via INTRAVENOUS
  Filled 2014-08-16 (×15): qty 1000

## 2014-08-16 MED ORDER — DEXMEDETOMIDINE HCL IN NACL 200 MCG/50ML IV SOLN: 0.2000 ug/kg/h | INTRAVENOUS | Status: DC

## 2014-08-16 MED ORDER — LEVALBUTEROL HCL 0.63 MG/3ML IN NEBU
0.6300 mg | INHALATION_SOLUTION | Freq: Four times a day (QID) | RESPIRATORY_TRACT | Status: DC | PRN
  Administered 2014-08-17: 0.63 mg via RESPIRATORY_TRACT
  Filled 2014-08-16: qty 3

## 2014-08-16 MED ORDER — ENOXAPARIN SODIUM 40 MG/0.4ML ~~LOC~~ SOLN
40.0000 mg | SUBCUTANEOUS | Status: DC
  Administered 2014-08-17 – 2014-08-20 (×4): 40 mg via SUBCUTANEOUS
  Filled 2014-08-16 (×4): qty 0.4

## 2014-08-16 MED ORDER — INSULIN ASPART 100 UNIT/ML ~~LOC~~ SOLN
0.0000 [IU] | Freq: Three times a day (TID) | SUBCUTANEOUS | Status: DC
  Administered 2014-08-16: 2 [IU] via SUBCUTANEOUS

## 2014-08-16 MED ORDER — SODIUM CHLORIDE 0.9 % IV BOLUS (SEPSIS)
500.0000 mL | Freq: Once | INTRAVENOUS | Status: AC
  Administered 2014-08-16: 500 mL via INTRAVENOUS

## 2014-08-16 MED ORDER — MORPHINE SULFATE 2 MG/ML IJ SOLN
2.0000 mg | Freq: Once | INTRAMUSCULAR | Status: AC
  Administered 2014-08-16: 2 mg via INTRAVENOUS
  Filled 2014-08-16: qty 1

## 2014-08-16 MED ORDER — LEVOTHYROXINE SODIUM 100 MCG IV SOLR
50.0000 ug | Freq: Every day | INTRAVENOUS | Status: DC
  Administered 2014-08-16 – 2014-08-20 (×5): 50 ug via INTRAVENOUS
  Filled 2014-08-16 (×5): qty 5

## 2014-08-16 MED ORDER — LORAZEPAM 2 MG/ML IJ SOLN
1.0000 mg | Freq: Once | INTRAMUSCULAR | Status: AC
  Administered 2014-08-16: 1 mg via INTRAVENOUS
  Filled 2014-08-16: qty 1

## 2014-08-16 MED ORDER — SODIUM CHLORIDE 0.9 % IV SOLN
750.0000 mg | Freq: Two times a day (BID) | INTRAVENOUS | Status: DC
  Administered 2014-08-16 – 2014-08-21 (×10): 750 mg via INTRAVENOUS
  Filled 2014-08-16 (×12): qty 7.5

## 2014-08-16 MED ORDER — LORAZEPAM 2 MG/ML IJ SOLN
2.0000 mg | INTRAMUSCULAR | Status: DC | PRN
  Administered 2014-08-16 – 2014-08-19 (×4): 2 mg via INTRAVENOUS
  Filled 2014-08-16 (×4): qty 1

## 2014-08-16 MED ORDER — DEXTROSE 5 % IV SOLN
INTRAVENOUS | Status: DC
  Administered 2014-08-16: 09:00:00 via INTRAVENOUS

## 2014-08-16 MED ORDER — LEVALBUTEROL HCL 1.25 MG/0.5ML IN NEBU
1.2500 mg | INHALATION_SOLUTION | Freq: Once | RESPIRATORY_TRACT | Status: AC
  Administered 2014-08-16: 1.25 mg via RESPIRATORY_TRACT
  Filled 2014-08-16: qty 0.5

## 2014-08-16 NOTE — Progress Notes (Signed)
CARE MANAGEMENT NOTE 08/16/2014  Patient:  Ricardo [name],Ricardo [name]   Account Number:  [id number]  Date Initiated:  08/16/2014  Documentation initiated by:  Shterna Laramee  Subjective/Objective Assessment:   severe sepsis     Action/Plan:   [facility]   Anticipated DC Date:  08/19/2014   Anticipated DC Plan:  ASSISTED LIVING / REST HOME  In-house referral  Clinical Social Worker      DC Planning Services  CM consult      PAC Choice  NA  NA   Choice offered to / List presented to:  NA   DME arranged  NA      DME agency  NA     HH arranged  NA      HH agency  NA   Status of service:  In process, will continue to follow Medicare Important Message given?   (If response is "NO", the following Medicare IM given date fields will be blank) Date Medicare IM given:   Medicare IM given by:   Date Additional Medicare IM given:   Additional Medicare IM given by:    Discharge Disposition:    Per UR Regulation:  Reviewed for med. necessity/level of care/duration of stay  If discussed at Long Length of Stay Meetings, dates discussed:    Comments:  [id number]/Nalaysia Manganiello Earlene PlaterDavis, RN, BSN, CCN: [phone]/ Case management. Chart reviewed for discharge planning and present needs. Discharge needs: none present at time of review. Next chart review due:  [id number]

## 2014-08-16 NOTE — Progress Notes (Signed)
PHARMACY - LOVENOX  Pharmacy requested to adjust Lovenox dose as needed for VTE prophylaxis.  Original order for Lovenox 30mg  sq q24h.  Height = 67 inches Weight = 58.9 kg BMI = 20 CrCl = 32.3 ml/min  Assessment:  As TBW > 45 kg and CrCl > 30 ml/min, will adjust Lovenox to recommended dose of 40mg  q24h beginning 2/10 (30mg  Lovenox given on 2/9)  Plan:  Change Lovenox to 40mg  sq q24h for VTE prophylaxis.  Terrilee FilesLeann Le Faulcon, PharmD 08/16/14 @ 20:29

## 2014-08-16 NOTE — Progress Notes (Addendum)
CRITICAL VALUE ALERT  Critical value received:  Na 174, Cl >130  Date of notification:  08/16/2014  Time of notification:  1500  Critical value read back:Yes.    Nurse who received alert:  Santiago GladAllison Koontz, RN  MD notified (1st page):  Dr. Izola PriceMyers  Time of first page: 1502 - continue d5 at the same rate   MD notified (2nd page):  Time of second page:  Responding MD:  Awaiting response  Time MD responded:  Awaiting response

## 2014-08-16 NOTE — Progress Notes (Signed)
   08/16/14 1733  Vitals  Temp (!) 102.1 F (38.9 C)  Temp Source Axillary  BP (!) 79/41 mmHg  MAP (mmHg) 53  Pulse Rate 95  ECG Heart Rate 95  Resp (!) 51  Oxygen Therapy  SpO2 93 %  O2 Device Nasal Cannula  O2 Flow Rate (L/min) 5 L/min   Patient with respiratory rate increased to the 50's. Verified with auscultation short/shallow breaths. Ronchi heard on right side. Dr. Izola PriceMyers paged x2. Responded with orders for stat portable CXR and ABG's. Dr. Izola PriceMyers is attempting to contact legal guardian.

## 2014-08-16 NOTE — Progress Notes (Signed)
Pt with unstable ABG's: Oxygen 46.1, pH 7.50, CO2 20.6, HCO3 16. Message relayed from MD at shift changed that pt will not be intubated although he remains Full Code. Mask changed from venti to nonrebreather at 1930 due to increased RR although pt O2 staturation remains between 96-100%. All other V/S remain stable.  Continually in contact with MD about pt respiratory status. Due to continual respiratory rate increases MD notified again and orders for Bi-PAP and nebulizer treatments ordered. While in contact ativan and morphine ordered for respiratory rate. Time is now 2300. No other orders given at this time

## 2014-08-16 NOTE — Progress Notes (Addendum)
Patient ID: Ricardo [name], male   DOB: [birth year], 80 y.o.   MRN: [id number]  [facility] PROGRESS NOTE  Susan Arana WUJ:[id number] DOB: Dec 28, 1934 DOA: 08-26-2014 PCP: Oneal Grout, MD   Brief narrative:    HPI:  Pt is 80 yo male from SNF, with severe dementia and limited mobility, mostly bed bound per reports, legal guardian [name] from [facility] social services department, now presented to [facility] ED after noted to be minimally responsive and with poor oral intake for one week. Pt is unable to provide any history due to acute illness and underlying dementia and most of the details obtained from available records and ED doctor. Pt has had several episodes of hypoxia but no clear documentation of how low oxygen sat's were.   In ED, pt is lethargic, severity contracted in upper and lower extremities, difficult to arouse even with noxious stimuli. VS notable for Tmax 101 F, BP 89/54, RR 22 bpm, oxygen saturation 89 % on RA. Blood work notable for severe hypernatremia > 175. TR[facility] asked to admit to ICU for further evaluation.   Please note that pt has legal guardian Miss Danna Hefty ([place] social services). She has sent me paperwork to fill out to consider change of code status from full code to DNR given significant risk of harm to patient if he is to require CPR or life support (given critical nature of pt's illness imposed of severe debility, increased muscle tone and contractures). Paperwork requires attending physician statement of severity of pt's illness and risks of proceeding CPR and intubations if required, also requires non attending physician statement (I consulted palliative care team to assist with this as was recommended by guardian). Both statements need to be notarized (mine is and Environmental consultant in ICU can do it for [place]). This is to be faxed back to [phone] so the case can be reviewed and appropriate change can be made to ensure comfort for pt.   I spoke with family at bedside, family  explained that pt was apparently able to sit in a wheelchair last week and was able to talk, but per legal guardian, pt with very limited functional status, minimally verbal at baseline and with severe dementia.  Major events since admission: 2/9 - appears more agitated and still non verbal, Na > 174, changed fluids from NS to D5, PCT consulted   Assessment and Plan: Active Problems: Severe sepsis - appears to be urinary source based on UA - pt admitted to ICU and still requires ICU level of care - continue Vancomycin and Zosyn day #2, follow up on urine and blood cultures - lactic acid is trending down but still elevated at 2.8 this AM (3.8 --> 2.8), pt still with Tmax 101.3 F - continue fluid resuscitation and repeat lactic acid level in AM  Severe electrolyte disturbances - hypernatremia, hypokalemia - hypokalemia resolved, K is 4.6 this AM - Na is still elevated, NS changed to D5, will repeat BMP today and again in AM Acute renal failure with metabolic acidosis  - secondary to sepsis, decreased effective circulatory volume  - Cr trend since admission: 1.78 --> 1.49  - IVF as noted above and repeat BMP  Acute encephalopathy, metabolic, sepsis, and imposed on severe dementia - more agitated this AM, moving head side to side - place on Ativan as needed as pt was on at the SNF to avoid withdrawals - consulted PCT to discuss GOC with guardian as well and to help complete necessary paperwork to allow change in  code status so that we can focus on more comfort care (given pt's advanced dementia, very limited functional status and dependence on others in terms of all ADL's) Anemia of chronic iron deficiency, thrombocytopenia  - Hg 11.1 which is around pt's baseline - no signs of active bleeding  - IVF as noted above  Severe dementia, FTT, severe PCM, functional quadriplegia - keep NPO, pt non verbal, unable to take anything PO, high aspiration risk  - very poor functional status with  guarded prognosis  - GOC to be discussed with assistance of PCT - SLP to be done if possible  DM type II - no specific medication on the pt's list but CBG's in 150 - 200's - A1C pending - placed on SSI for now Seizures - continue Keppra and Depakote IV route for now Hypothyroidism - TSH depressed 0.23 - will lower the dose of synthroid from 87.5 --> 50 mcg dose, but continue to provide it via IV route  Elevated troponins - in the setting of sepsis and demands ischemia - spoke with cardiologist on call, pt not a candidate for any intervention given underlying dementia and recommendation is to proceed with conservative approach  Transaminitis - slightly better this AM, repeat CMET in AM DVT prophylaxis - Lovenox SQ  Code Status: Full Family Communication:  plan of care discussed with the legal guardian  Disposition Plan: Needs to stay in ICU for now until code status established, PCT to assist with paperwork completion (papers given to secretary in ICU 08/16/14). Needs to be faxed to the number [phone] so that pt's case can be discussed further.   IV access:  Peripheral IV  Procedures and diagnostic studies:    Ct Head Wo Contrast  08/08/2014   No acute abnormality.  Atrophy and chronic microvascular ischemic change.    CXR 08/31/2014  No edema or consolidation.     Medical Consultants:  PCT  Other Consultants:  None  IAnti-Infectives:   Vancomycin 2/8 --> Zosyn 2/8 -->  Debbora PrestoMAGICK-Blessen Kimbrough, MD  [facility] Pager [phone]  If 7PM-7AM, please contact night-coverage [email or web address] Password [email or web address] 08/16/2014, 1:26 PM   LOS: 1 day   HPI/Subjective: No events overnight.   Objective: Filed Vitals:   08/16/14 0800 08/16/14 1000 08/16/14 1100 08/16/14 1200  BP: 82/57 147/77 140/77   Pulse: 98 91 95   Temp: 99.6 F (37.6 C)   98.9 F (37.2 C)  TempSrc: Axillary   Axillary  Resp: 16 19    Height:      Weight:      SpO2: 95% 96%      Intake/Output Summary (Last 24 hours) at  08/16/14 1326 Last data filed at 08/16/14 1033  Gross per 24 hour  Intake 2699.58 ml  Output    630 ml  Net 2069.58 ml    Exam:   General:  Pt is somewhat agitated, moving head left and right, non verbal   Cardiovascular: Regular rate and rhythm,  no rubs, no gallops  Respiratory: Clear to auscultation bilaterally, no wheezing, rhonchi at bases, poor inspiratory effort   Abdomen: Soft, non tender, non distended, bowel sounds present, no guarding  Extremities: contracted, pulses DP and PT palpable bilaterally  Neuro: Does not open eyes, non verbal, not following any commands, moving all 4 extremities spontaneously but not against gravity   Data Reviewed: Basic Metabolic Panel:  Recent Labs Lab 05-12-2015 1353 05-12-2015 1915 08/16/14 0221  NA 175* 170* 174*  K 3.0* 3.2* 4.6  CL >130* >130* >  130*  CO2 25 20 18*  GLUCOSE 208* 213* 128*  BUN 73* 67* 55*  CREATININE 1.78* 1.49* 1.49*  CALCIUM 9.0 7.9* 7.7*  MG  --  2.1  --   PHOS  --  2.2*  --    Liver Function Tests:  Recent Labs Lab 08-31-14 1353 08/16/14 0221  AST 86* 82*  ALT 68* 54*  ALKPHOS 212* 155*  BILITOT 2.5* 2.5*  PROT 7.3 5.7*  ALBUMIN 3.3* 2.4*   CBC:  Recent Labs Lab 31-Aug-2014 1353 08/16/14 0221  WBC 10.5 8.0  NEUTROABS 7.7  --   HGB 12.8* 11.1*  HCT 41.6 34.8*  MCV 102.2* 101.2*  PLT 121* 106*   Cardiac Enzymes:  Recent Labs Lab 2014-08-31 1915 08/16/14 0221 08/16/14 0745  TROPONINI 0.14* 0.15* 0.16*   Recent Results (from the past 240 hour(s))  Culture, blood (routine x 2)     Status: None (Preliminary result)   Collection Time: Aug 31, 2014  1:54 PM  Result Value Ref Range Status   Specimen Description BLOOD RIGHT ANTECUBITAL  Final   Special Requests BOTTLES DRAWN AEROBIC AND ANAEROBIC  Final   Culture   Final           BLOOD CULTURE RECEIVED NO GROWTH TO DATE CULTURE WILL BE HELD FOR 5 DAYS BEFORE ISSUING A FINAL NEGATIVE REPORT Performed at [facility]    Report  Status PENDING  Incomplete  Culture, blood (routine x 2)     Status: None (Preliminary result)   Collection Time: 08/31/2014  3:30 PM  Result Value Ref Range Status   Specimen Description BLOOD RIGHT HAND  Final   Special Requests BOTTLES DRAWN AEROBIC ONLY 6CC  Final   Culture   Final           BLOOD CULTURE RECEIVED NO GROWTH TO DATE CULTURE WILL BE HELD FOR 5 DAYS BEFORE ISSUING A FINAL NEGATIVE REPORT Performed at [facility]    Report Status PENDING  Incomplete  MRSA PCR Screening     Status: None   Collection Time: August 31, 2014  6:43 PM  Result Value Ref Range Status   MRSA by PCR NEGATIVE NEGATIVE Final     Scheduled Meds: . enoxaparin injection  30 mg Subcutaneous Q24H  . levETIRAcetam  750 mg Intravenous Q12H  . levothyroxine  87.5 mcg Intravenous Daily  . ZOSYN  IV  3.375 g Intravenous 3 times per day  . valproate sodium  500 mg Intravenous Q12H  . vancomycin  750 mg Intravenous Q24H   Continuous Infusions: . dextrose 75 mL/hr at 08/16/14 [phone]

## 2014-08-16 NOTE — Progress Notes (Addendum)
Clinical Social Work Department BRIEF PSYCHOSOCIAL ASSESSMENT 08/16/2014  Patient:  Ricardo [name],Ricardo [name]     Account Number:  [id number]     Admit date:  08/11/2014  Clinical Social Worker:  Maryln Manuel  Date/Time:  08/16/2014 03:00 PM  Referred by:  Physician  Date Referred:  08/16/2014 Referred for  SNF Placement   Other Referral:   pt admitted from [place] type:  Family Other interview type:   pt has legal guardian, Information systems manager    PSYCHOSOCIAL DATA Living Status:  FACILITY Admitted from facility:  [facility] Level of care:  [place] Primary support name:  [name]/legal [phone] Primary support relationship to patient:  NONE Degree of support available:   pt son, Quita Skye and pt daughter, Stanton Kidney at bedside    CURRENT CONCERNS Current Concerns  Post-Acute Placement   Other Concerns:   pt admitted from [facility], pt  has a guardian through [place], but pt family is at bedside and have questions regarding pt care.    SOCIAL WORK ASSESSMENT / PLAN CSW received referral that pt admitted from [facility].    CSW reviewed chart and noted that MD has consulted Palliative Care for Goals of Care.    Per chart, pt has a legal guardian, [name],[phone].    CSW received phone call from [facility] that pt family at bedside and have questions regarding pt care.    CSW attempted to contact pt guardian, Barbarann Ehlers, but unable to reach. CSW left voice message.    CSW contacted [employer] and received information from [employer], [place] April that medical information can be shared with pt family, but legal guardian from [place] is sole Media planner.    CSW met with pt son, Quita Skye and pt daughter, Stanton Kidney at bedside. Pt son's wife present at this time. Pt family shared that pt was admitted to [facility] in 2014 with his wife as they were unable to continue to care for themselves at home. Pt wife  passed away and pt has remained at [facility]. Per pt family, during the time that pt was initially at [facility], pt son was working 68 hours a week and pt daughter was a Administrator and they were unavailable to make timely decisions for pt and that is when legal guardianship through DSS was put into place by facility in order to have guardian to make decisions.    CSW provided supportive listening as pt family discussed their concerns about the guardian and how they feel that pt guardian is not actively involved. Pt family also expressed concerns about [facility] and feel that facility is not providing the appropriate care to pt and feel that facility "falsifies documents". Pt family have not received update on pt condition since admission and are unsure of pt condition.    CSW provided resource to pt family for [facility] Long-Term Ombudsman Program for family to contact re: concerns about facility.    CSW encouraged pt family to contact DSS guardian supervisor regarding their concerns about how pt guardian is handling pt case. Pt family expressed that they have contacted pt guardian supervisor in the past, but have not had success with receiving a return phone call per their report.    CSW contacted pt guardian supervisor, Neysa Hotter via telephone. CSW introduced self. CSW notified DSS supervisor of pt family concerns. DSS supervisor stated that this CSW could provide pt family her contact information for any concerns.  Pt gaurdian, [name] was available at this time as well and was able to get on speaker phone with DSS supervisor and this CSW. Per guardian, medical information can be shared with pt family, but decisions need to be made by DSS guardian regarding pt care.    Per DSS guardian, the DNR paperwork needs to be filled out by the attending physician, non-attending physcian (i.e.: Palliative MD or PCCM MD), and pt family has to express their wishes on paper as well. Once all completed  paperwork needs to be faxed to [place] guardian.    CSW paged MD and notified MD that medical information can be shared with pt family.    CSW to follow up following palliative care consult recommendations.    CSW to assist with ensuring paperwork completed by attending and non-attending physicians and obtain pt family statement re: wishes for pt to fax to [place].    CSW to continue to follow to provide support.   Assessment/plan status:  Psychosocial Support/Ongoing Assessment of Needs Other assessment/ plan:   Information/referral to community resources:   referrals dependent on pt outcomes    PATIENT'S/FAMILY'S RESPONSE TO PLAN OF CARE: Pt disoriented x 4. Pt family expressed frustration regarding pt legal guardian response to the situation and feel that pt legal guardian should have more of a presence or be readily available by telephone given pt condition. Pt family anxious regarding legal guardian's decisions and are hopeful that when they express their wishes for pt that legal guardian will consider their wishes. Pt family holding out hope that pt will begin to response to treatments.    Alison Murray, MSW, [place] Work [phone]

## 2014-08-16 NOTE — Progress Notes (Signed)
Inpatient Diabetes Program Recommendations  AACE/ADA: New Consensus Statement on Inpatient Glycemic Control (2013)  Target Ranges:  Prepandial:   less than 140 mg/dL      Peak postprandial:   less than 180 mg/dL (1-2 hours)      Critically ill patients:  140 - 180 mg/dL     Results for Denton BrickMATTON, Rudolfo (MRN [id number]) as of 08/16/2014 09:34  Ref. Range 08/30/2014 13:53 09/01/2014 19:15 08/16/2014 02:21  Glucose Latest Units: mg/dL 657208 (H) 846213 (H) 962128 (H)    80 yo male from SNF, with severe dementia and limited mobility, mostly bed bound per reports, legal guardian Cottie BandaDontay Woolard [facility], [facility] after noted to be minimally responsive and with poor oral intake for several days. Pt is unable to provide any history due to acute illness and underlying dementia and most of the details obtained from available records and ED doctor. Pt has had several episodes of hypoxia but no clear documentation of how low oxygen sat's were. Blood work notable for severe hypernatremia > 175.  Admitted to ICU.  History of DM, Dementia, HTN.  Home DM  Meds: None listed  Current Orders: None at this time     MD- Patient does carry a diagnosis of DM.  Do you want to check CBGs and cover with Novolog SSI?    Thanks! Ambrose FinlandJeannine Johnston Bernardina Cacho RN, MSN, CDE Diabetes Coordinator Inpatient Diabetes Program Team Pager: [phone] (8a-10p)

## 2014-08-16 NOTE — Progress Notes (Signed)
CRITICAL VALUE ALERT  Critical value received:  Na 174 Chloride >130  Date of notification:  08/16/2014  Time of notification:  0315  Critical value read back:Yes.    Nurse who received alert: Drue Stageraroline Ladainian Therien  MD notified (1st page):  Schorr  Time of first page:  0330  MD notified (2nd page):  Time of second page:  Responding MD: Schorr  Time MD responded:  [phone]

## 2014-08-17 ENCOUNTER — Inpatient Hospital Stay (HOSPITAL_COMMUNITY): Payer: Medicare Other

## 2014-08-17 DIAGNOSIS — J189 Pneumonia, unspecified organism: Secondary | ICD-10-CM

## 2014-08-17 DIAGNOSIS — N390 Urinary tract infection, site not specified: Secondary | ICD-10-CM

## 2014-08-17 DIAGNOSIS — Z515 Encounter for palliative care: Secondary | ICD-10-CM

## 2014-08-17 DIAGNOSIS — R6521 Severe sepsis with septic shock: Secondary | ICD-10-CM

## 2014-08-17 DIAGNOSIS — J9601 Acute respiratory failure with hypoxia: Secondary | ICD-10-CM | POA: Diagnosis present

## 2014-08-17 DIAGNOSIS — R0600 Dyspnea, unspecified: Secondary | ICD-10-CM

## 2014-08-17 DIAGNOSIS — Z7189 Other specified counseling: Secondary | ICD-10-CM

## 2014-08-17 DIAGNOSIS — A419 Sepsis, unspecified organism: Principal | ICD-10-CM

## 2014-08-17 LAB — GLUCOSE, CAPILLARY
Glucose-Capillary: 241 mg/dL — ABNORMAL HIGH (ref 70–99)
Glucose-Capillary: 232 mg/dL — ABNORMAL HIGH (ref 70–99)
Glucose-Capillary: 180 mg/dL — ABNORMAL HIGH (ref 70–99)
Glucose-Capillary: 209 mg/dL — ABNORMAL HIGH (ref 70–99)

## 2014-08-17 LAB — CBC
HCT: 28.5 % — ABNORMAL LOW (ref 39.0–52.0)
Hemoglobin: 9.1 g/dL — ABNORMAL LOW (ref 13.0–17.0)
MCH: 31.5 pg (ref 26.0–34.0)
MCHC: 31.9 g/dL (ref 30.0–36.0)
MCV: 98.6 fL (ref 78.0–100.0)
PLATELETS: 62 10*3/uL — AB (ref 150–400)
RBC: 2.89 MIL/uL — AB (ref 4.22–5.81)
RDW: 15.7 % — AB (ref 11.5–15.5)
WBC: 5.8 10*3/uL (ref 4.0–10.5)

## 2014-08-17 LAB — BASIC METABOLIC PANEL
BUN: 43 mg/dL — ABNORMAL HIGH (ref 6–23)
CO2: 14 mmol/L — AB (ref 19–32)
Calcium: 7.5 mg/dL — ABNORMAL LOW (ref 8.4–10.5)
Creatinine, Ser: 1.48 mg/dL — ABNORMAL HIGH (ref 0.50–1.35)
GFR calc Af Amer: 50 mL/min — ABNORMAL LOW (ref >90)
GFR calc non Af Amer: 44 mL/min — ABNORMAL LOW (ref >90)
Glucose, Bld: 292 mg/dL — ABNORMAL HIGH (ref 70–99)
Potassium: 3.5 mmol/L (ref 3.5–5.1)
Sodium: 160 mmol/L — ABNORMAL HIGH (ref 135–145)

## 2014-08-17 LAB — BLOOD GAS, ARTERIAL
ACID-BASE DEFICIT: 7 mmol/L — AB (ref 0.0–2.0)
BICARBONATE: 16 meq/L — AB (ref 20.0–24.0)
Drawn by: 31814
FIO2: 1 %
O2 Saturation: 95.1 %
Patient temperature: 101.7
TCO2: 14.8 mmol/L (ref 0–100)
pCO2 arterial: 27.5 mmHg — ABNORMAL LOW (ref 35.0–45.0)
pH, Arterial: 7.391 (ref 7.350–7.450)
pO2, Arterial: 81.7 mmHg (ref 80.0–100.0)

## 2014-08-17 LAB — URINE CULTURE
CULTURE: NO GROWTH
Colony Count: NO GROWTH

## 2014-08-17 LAB — COMPREHENSIVE METABOLIC PANEL
ALK PHOS: 110 U/L (ref 39–117)
ALT: 35 U/L (ref 0–53)
AST: 45 U/L — AB (ref 0–37)
Albumin: 1.9 g/dL — ABNORMAL LOW (ref 3.5–5.2)
BUN: 43 mg/dL — ABNORMAL HIGH (ref 6–23)
CALCIUM: 7.2 mg/dL — AB (ref 8.4–10.5)
CO2: 17 mmol/L — ABNORMAL LOW (ref 19–32)
Chloride: >130 mmol/L (ref 96–112)
Creatinine, Ser: 1.63 mg/dL — ABNORMAL HIGH (ref 0.50–1.35)
GFR calc Af Amer: 45 mL/min — ABNORMAL LOW (ref >90)
GFR calc non Af Amer: 39 mL/min — ABNORMAL LOW (ref >90)
Glucose, Bld: 273 mg/dL — ABNORMAL HIGH (ref 70–99)
POTASSIUM: 2.9 mmol/L — AB (ref 3.5–5.1)
Sodium: 165 mmol/L (ref 135–145)
TOTAL PROTEIN: 4.8 g/dL — AB (ref 6.0–8.3)
Total Bilirubin: 1.4 mg/dL — ABNORMAL HIGH (ref 0.3–1.2)

## 2014-08-17 LAB — LACTIC ACID, PLASMA
LACTIC ACID, VENOUS: 2.3 mmol/L — AB (ref 0.5–2.0)
Lactic Acid, Venous: 2.7 mmol/L (ref 0.5–2.0)

## 2014-08-17 LAB — MAGNESIUM: Magnesium: 1.7 mg/dL (ref 1.5–2.5)

## 2014-08-17 MED ORDER — SODIUM CHLORIDE 0.9 % IV BOLUS (SEPSIS): 250.0000 mL | Freq: Once | INTRAVENOUS | Status: AC

## 2014-08-17 MED ORDER — INSULIN ASPART 100 UNIT/ML ~~LOC~~ SOLN
0.0000 [IU] | SUBCUTANEOUS | Status: DC
  Administered 2014-08-17: 2 [IU] via SUBCUTANEOUS
  Administered 2014-08-17 (×2): 3 [IU] via SUBCUTANEOUS
  Administered 2014-08-17: 2 [IU] via SUBCUTANEOUS
  Administered 2014-08-17: 3 [IU] via SUBCUTANEOUS
  Administered 2014-08-18: 1 [IU] via SUBCUTANEOUS
  Administered 2014-08-18: 2 [IU] via SUBCUTANEOUS
  Administered 2014-08-18: 3 [IU] via SUBCUTANEOUS
  Administered 2014-08-18: 2 [IU] via SUBCUTANEOUS
  Administered 2014-08-18: 3 [IU] via SUBCUTANEOUS
  Administered 2014-08-19: 2 [IU] via SUBCUTANEOUS
  Administered 2014-08-19: 1 [IU] via SUBCUTANEOUS
  Administered 2014-08-19 (×2): 2 [IU] via SUBCUTANEOUS
  Administered 2014-08-19 (×2): 1 [IU] via SUBCUTANEOUS

## 2014-08-17 MED ORDER — SODIUM CHLORIDE 0.9 % IV BOLUS (SEPSIS): 500.0000 mL | Freq: Once | INTRAVENOUS | Status: AC

## 2014-08-17 MED ORDER — SODIUM CHLORIDE 0.9 % IV BOLUS (SEPSIS)
250.0000 mL | Freq: Once | INTRAVENOUS | Status: AC
  Administered 2014-08-17: 250 mL via INTRAVENOUS

## 2014-08-17 MED ORDER — POTASSIUM CHLORIDE 10 MEQ/100ML IV SOLN
10.0000 meq | INTRAVENOUS | Status: AC
  Administered 2014-08-17 (×4): 10 meq via INTRAVENOUS
  Filled 2014-08-17 (×4): qty 100

## 2014-08-17 MED ORDER — SODIUM CHLORIDE 0.9 % IV BOLUS (SEPSIS)
500.0000 mL | Freq: Once | INTRAVENOUS | Status: AC
  Administered 2014-08-17: 500 mL via INTRAVENOUS

## 2014-08-17 MED ORDER — VANCOMYCIN HCL IN DEXTROSE 1-5 GM/200ML-% IV SOLN
1000.0000 mg | INTRAVENOUS | Status: DC
  Administered 2014-08-17 – 2014-08-20 (×4): 1000 mg via INTRAVENOUS
  Filled 2014-08-17 (×4): qty 200

## 2014-08-17 MED ORDER — DOPAMINE-DEXTROSE 3.2-5 MG/ML-% IV SOLN
0.0000 ug/kg/min | INTRAVENOUS | Status: DC
  Administered 2014-08-17: 5 ug/kg/min via INTRAVENOUS
  Administered 2014-08-17: 11 ug/kg/min via INTRAVENOUS
  Administered 2014-08-19: 15 ug/kg/min via INTRAVENOUS
  Administered 2014-08-19: 20 ug/kg/min via INTRAVENOUS
  Administered 2014-08-20: 15 ug/kg/min via INTRAVENOUS
  Administered 2014-08-21: 20 ug/kg/min via INTRAVENOUS
  Filled 2014-08-17 (×6): qty 250

## 2014-08-17 MED ORDER — DOPAMINE-DEXTROSE 3.2-5 MG/ML-% IV SOLN
INTRAVENOUS | Status: AC
  Filled 2014-08-17: qty 250

## 2014-08-17 MED ORDER — SODIUM CHLORIDE 0.9 % IV SOLN: INTRAVENOUS | Status: DC | PRN

## 2014-08-17 MED ORDER — SODIUM CHLORIDE 0.9 % IV BOLUS (SEPSIS)
1000.0000 mL | Freq: Once | INTRAVENOUS | Status: AC
  Administered 2014-08-17: 1000 mL via INTRAVENOUS

## 2014-08-17 NOTE — Consult Note (Signed)
PULMONARY / CRITICAL CARE MEDICINE   Name: Ricardo [name] MRN: [id number] DOB: [birth year]    ADMISSION DATE:  09/01/2014 CONSULTATION DATE:  08/17/14  REFERRING MD :  Butler Denmark  CHIEF COMPLAINT:  Respiratory Failure and Shock  INITIAL PRESENTATION: 80 y/o male SNF resident with advanced dementia presented to [facility] with septic shock, hypernatremia, and respiratory failure.    STUDIES:    SIGNIFICANT EVENTS:    HISTORY OF PRESENT ILLNESS:  Ricardo [name] is a 80 y/o male with advanced dementia who lives in a nursing home was admitted by the [facility] service on 2/8 with severe sepsis from a UTI and possibly CAP.  She was admitted to the intensive care unit and treated with BiPAP as well as IV fluids and broad-spectrum antibiotics. He was noted to be hypernatremic with a sodium of 174 on admission. He was treated with D5 for the hypernatremia. Because of worsening respiratory failure he was placed on BiPAP, and then on 08/17/2014 he developed worsening shock requiring dopamine. Pulmonary and critical care medicine was consulted. Of note the patient is a ward of the state and has lived in a nursing facility for over 2 years. I have reviewed the nursing records which documents severe advanced dementia as well as intermittent combativeness.  In the last 2 years he had several falls, the most recent of which which was documented in October 2015. It sounds like since about that time he has been bedbound. His family notes that he has lost over 15 pounds in the last several weeks.  PAST MEDICAL HISTORY :   has a past medical history of Subdural hematoma, post-traumatic; Chronic indwelling Foley catheter; Dementia with aggressive behavior; Frequent falls; H/O alcohol abuse; DM (diabetes mellitus); HTN (hypertension); Sinus tachycardia; Seizures; and GERD (gastroesophageal reflux disease).  has no past surgical history on file. Prior to Admission medications   Medication Sig Start Date End Date Taking? Authorizing  Provider  acetaminophen (TYLENOL) 325 MG tablet 650 mg by PEG Tube route every 8 (eight) hours as needed. For fever/pain   Yes Historical Provider, MD  AMBULATORY NON FORMULARY MEDICATION Medication Name: Lorazepam 0.5 mg/ml gel YQM:VHQIO 1ml topically to skin twice daily routinely for agitation; Apply 1ml topically to skin every 6 hours as needed for agitation 08/17/13  Yes Kimber Relic, MD  carvedilol (COREG) 12.5 MG tablet 12.5 mg by PEG Tube route 2 (two) times daily with a meal.   Yes Historical Provider, MD  cholecalciferol (VITAMIN D) 1000 UNITS tablet Take 1,000 Units by mouth daily.   Yes Historical Provider, MD  dextrose (GLUTOSE) 40 % GEL Take 1 Tube by mouth once as needed for low blood sugar.   Yes Historical Provider, MD  divalproex (DEPAKOTE SPRINKLE) 125 MG capsule Take 500 mg by mouth 2 (two) times daily.    Yes Historical Provider, MD  docusate (COLACE) 60 MG/15ML syrup Take 100 mg by mouth 2 (two) times daily.   Yes Historical Provider, MD  famotidine (PEPCID) 20 MG tablet Take 1 tablet (20 mg total) by mouth daily. 07/19/13  Yes Sharee Holster, NP  levETIRAcetam (KEPPRA) 750 MG tablet Take 750 mg by mouth every 12 (twelve) hours.   Yes Historical Provider, MD  levothyroxine (SYNTHROID) 175 MCG tablet Take 1 tablet (175 mcg total) by mouth daily before breakfast. 01/05/14  Yes Sharee Holster, NP  LORazepam (ATIVAN) 0.5 MG tablet Take 1/2 tablet by mouth twice daily for anxiety. Hold if sedated. 09/02/2014  Yes Sharon Seller, NP  LORazepam (ATIVAN) 2 MG/ML injection Inject 0.185ml intramuscularly as needed for agitation; May repeat 0.385ml intramuscularly in 30 minutes if agitation continues; Inject 0.185ml intramuscularly every shift if resident is combative or violent 12/20/13  Yes Tiffany L Reed, DO  miconazole (MICOTIN) 2 % cream Apply 1 application topically 2 (two) times daily as needed (for scrotum/penis).   Yes Historical Provider, MD  omeprazole (PRILOSEC) 20 MG capsule Take 20 mg  by mouth daily.   Yes Historical Provider, MD  QUEtiapine (SEROQUEL) 25 MG tablet Take 75 mg by mouth 2 (two) times daily.   Yes Historical Provider, MD  rivastigmine (EXELON) 9.5 mg/24hr Place 9.5 mg onto the skin daily. For dementia   Yes Historical Provider, MD  sertraline (ZOLOFT) 25 MG tablet Take 75 mg by mouth daily. For depression and anxiety   Yes Historical Provider, MD  thiamine (VITAMIN B-1) 100 MG tablet Take 1 tablet (100 mg total) by mouth daily. 07/19/13  Yes Sharee Holstereborah S Green, NP   Allergies  Allergen Reactions  . Biaxin [Clarithromycin]     Unknown reaction     FAMILY HISTORY: SOCIAL HISTORY:REVIEW OF SYSTEMS:  Cannot obtain due to advanced dementia  SUBJECTIVE:   VITAL SIGNS: Temp:  [97.8 F (36.6 C)-102.1 F (38.9 C)] 97.8 F (36.6 C) (02/10 0829) Pulse Rate:  [72-100] 74 (02/10 0829) Resp:  [23-51] 35 (02/10 0829) BP: (65-163)/(29-135) 71/34 mmHg (02/10 0400) SpO2:  [92 %-100 %] 99 % (02/10 0829) Arterial Line BP: (75-101)/(31-40) 89/38 mmHg (02/10 0829) FiO2 (%):  [55 %-100 %] 100 % (02/09 2112) Weight:  [62.9 kg (138 lb 10.7 oz)] 62.9 kg (138 lb 10.7 oz) (02/10 0500) HEMODYNAMICS:   VENTILATOR SETTINGS: Vent Mode:  [-]  FiO2 (%):  [55 %-100 %] 100 % INTAKE / OUTPUT:  Intake/Output Summary (Last 24 hours) at 08/17/14 1011 Last data filed at 08/17/14 0800  Gross per 24 hour  Intake 3848.75 ml  Output    350 ml  Net 3498.75 ml    PHYSICAL EXAMINATION: General:  Cachectic, respiratory failure on BiPAP Neuro:  sonorous, stirs to painful touch HEENT:  BiPAP mask in place Cardiovascular:  tachycardic, no murmurs Lungs:  crackles bilaterally Abdomen:  bowel sounds are positive, nontender Musculoskeletal:  atrophy in legs and hands noted bilaterally Skin:  thin, no clear rash  LABS:  CBC  Recent Labs Lab 09/03/2014 1353 08/16/14 0221 08/17/14 0309  WBC 10.5 8.0 5.8  HGB 12.8* 11.1* 9.1*  HCT 41.6 34.8* 28.5*  PLT 121* 106* 62*   Coag's No  results for input(s): APTT, INR in the last 168 hours. BMET  Recent Labs Lab 08/16/14 0745 08/16/14 1538 08/17/14 0309  NA 174* 170* 165*  K 4.3 3.1* 2.9*  CL >130* >130* >130*  CO2 16* 17* 17*  BUN 52* 51* 43*  CREATININE 1.61* 1.57* 1.63*  GLUCOSE 116* 190* 273*   Electrolytes  Recent Labs Lab 09/04/2014 1915  08/16/14 0745 08/16/14 1538 08/17/14 0309  CALCIUM 7.9*  < > 7.9* 7.8* 7.2*  MG 2.1  --   --   --  1.7  PHOS 2.2*  --   --   --   --   < > = values in this interval not displayed. Sepsis Markers  Recent Labs Lab 08/20/2014 1955 08/18/2014 2235 08/17/14 0309  LATICACIDVEN 2.2* 2.8* 2.3*   ABG  Recent Labs Lab 08/16/14 1815 08/17/14 0242  PHART 7.503* 7.391  PCO2ART 20.6* 27.5*  PO2ART 46.1* 81.7   Liver Enzymes  Recent Labs Lab 09/12/2014 1353 08/16/14 0221 08/17/14 0309  AST 86* 82* 45*  ALT 68* 54* 35  ALKPHOS 212* 155* 110  BILITOT 2.5* 2.5* 1.4*  ALBUMIN 3.3* 2.4* 1.9*   Cardiac Enzymes  Recent Labs Lab Sep 12, 2014 1915 08/16/14 0221 08/16/14 0745  TROPONINI 0.14* 0.15* 0.16*   Glucose  Recent Labs Lab 08/16/14 1555 08/16/14 2224 08/17/14 0829  GLUCAP 168* 190* 241*    Imaging Dg Chest Port 1 View  08/16/2014   CLINICAL DATA:  Shortness of breath beginning today. History of hypertension, diabetes, former smoker.  EXAM: PORTABLE CHEST - 1 VIEW  COMPARISON:  2014-09-12  FINDINGS: Normal heart size and pulmonary vascularity. Shallow inspiration with atelectasis in the lung bases. No focal airspace disease or consolidation. No blunting of costophrenic angles. No pneumothorax.  IMPRESSION: Shallow inspiration with atelectasis in the lung bases.   Electronically Signed   By: Burman Nieves M.D.   On: 08/16/2014 18:36     ASSESSMENT / PLAN:  PULMONARY A: Acute hypoxemic respiratory failure due to healthcare associated pneumonia P:   Continue BiPAP when necessary, will not escalate to mechanical ventilation titrate O2 to maintain O2  saturation greater than 88%  CARDIOVASCULAR A: Septic shock P:  -Only use dopamine, do not add further pressors  RENAL A:  Hypernatremia, improving with D5 Acute kidney injury secondary to septic shock Volume overload in setting of D5 administration P:   -Decrease rate of D5 considering volume overload  GASTROINTESTINAL A:  No acute issues P:    Nothing by mouth on BiPAP  HEMATOLOGIC A:  Anemia, without bleeding Thrombocytopenia, worsening, likely DIC in the setting of severe sepsis P:  Monitor for bleeding  INFECTIOUS A:  Septic shock secondary to age And urinary tract infection P:   BCx2 February 8> UC February 8>  Vancomycin February 8>  Zosyn February 8>   ENDOCRINE A:  Hyperglycemia P:    SSI  NEUROLOGIC A:  Acute encephalopathy in the setting of severe sepsis Severe advanced dementia History of subdural hematoma History of alcohol abuse in the past P:   -minimize sedating medications   FAMILY  - Updates: Daughter Ricardo [name] updated by phone 2/10 AM   CODE STATUS discussion: I have changed Ricardo [name]'s CODE STATUS to DO NOT RESUSCITATE. I have reviewed his chart at length and have discussed his care and his overall situation with his daughter. It should be noted that we have made multiple attempts to contact his guardian from the state this morning (multiple nurses, multiple social workers) and as of this minute we have been unable to reach them. Upon review of the chart it is very clear that this gentleman has advanced dementia with a history of multiple falls and on his physical exam it is clear that he is suffering from advanced dementia and that he has muscle atrophy and advanced delirium. His current condition is critical considering septic shock from multiple bacterial sources despite broad-spectrum, powerful and appropriately dosed antibiotics. Prior to my consultation he was placed on noninvasive mechanical ventilation as well as vasopressor support for  septic shock. Considering his advanced dementia, multiple comorbid illnesses, and high likelihood of death from his current acute illness, providing full mechanical ventilatory support in the event of worsening respiratory failure or CPR in the event of cardiac arrest would be medically nonbeneficial. I have taken the time to call his daughter Ricardo [name] to explain that her father is dying and it would be best if they came in to  be with him. I explained to her that I was not calling for decision-making but I did explain that I made him a DO NOT RESUSCITATE. She did not oppose this because she said she did not want him to be on machines or him to suffer from broken ribs from CPR. As of now, we will continue to make attempts to contact his guardian.  We will not escalate care further from BiPAP and the dopamine that he is currently using. Hopefully he will make a turnaround with these measures alone, but as I explained to his daughter Ricardo [name], his likelihood of death from septic shock  is exceedingly high.  My cc time 45 minutes  Heber Seneca, MD [facility] Pager: [phone] Cell: [phone] If no response, call [phone]  08/17/2014, 10:11 AM

## 2014-08-17 NOTE — Progress Notes (Addendum)
[facility] Progress Note   Ricardo [name] ZOX:[id number] DOB: [birth year] DOA: 09-09-2014 PCP: Oneal Grout, MD  Brief narrative: Ricardo [name] is a 80 y.o. male from SNF who is a ward of the state admitted with lethargy and found to have sepsis, hypoxia, UTI, pneumonia and severe hypernatremia. His children have spoken with me and state that when they went to see him 3 days ago, he had a cough, appears sick and they requested he be admitted to the hospital on that day.    Subjective: Unresponsive on BiPAP  Assessment/Plan: Principal Problem:   Septic shock - given numerous fluid boluses without improvement in BP- Dopamine started- BP improved for now (A) UTI (B) HCAP Cont Vanc and Zosyn for coverage of above- f/u cultures  Active Problems:   Acute respiratory failure with hypoxia - due to HCAP - BIPAP- do not intubate- Dr Kendrick Fries discussed this with family- we have signed appropriate paperwork to make him a DNR as we are unable to reach his  Legal guardian.   Hypokalemia - being replaced via IV    Hypothyroidism - cont synthroid IV  Seizure disorder - cont Keppra    Hypertensive heart and renal disease - currently hypotensive    AKI- Chronic kidney disease, stage II (mild)/ hypernatremia - due to severe dehydration- has received aggressive hydration- currently on D5 W    Dementia with psychosis - unable to resume home meds due to lethargy  Code Status: DNR Family Communication: daughter and son Disposition Plan: critically ill DVT prophylaxis: Lovenox  Consultants: PCCM  Procedures:   Antibiotics: Anti-infectives    Start     Dose/Rate Route Frequency Ordered Stop   08/17/14 1800  vancomycin (VANCOCIN) IVPB 1000 mg/200 mL premix     1,000 mg 200 mL/hr over 60 Minutes Intravenous Every 24 hours 08/17/14 1002     09/09/2014 2200  piperacillin-tazobactam (ZOSYN) IVPB 3.375 g     3.375 g 12.5 mL/hr over 240 Minutes Intravenous 3 times per day 09-09-2014  1832     2014/09/09 2000  vancomycin (VANCOCIN) IVPB 750 mg/150 ml premix  Status:  Discontinued     750 mg 150 mL/hr over 60 Minutes Intravenous Every 24 hours 09-Sep-2014 1832 08/17/14 1002   09-09-2014 1530  levofloxacin (LEVAQUIN) IVPB 750 mg     750 mg 100 mL/hr over 90 Minutes Intravenous  Once Sep 09, 2014 1522 September 09, 2014 1720   09-09-2014 1530  aztreonam (AZACTAM) 2 g in dextrose 5 % 50 mL IVPB     2 g 100 mL/hr over 30 Minutes Intravenous  Once September 09, 2014 1522 09-Sep-2014 1808         Objective: Filed Weights   2014/09/09 1820 08/16/14 0500 08/17/14 0500  Weight: 57.6 kg (126 lb 15.8 oz) 58.9 kg (129 lb 13.6 oz) 62.9 kg (138 lb 10.7 oz)    Intake/Output Summary (Last 24 hours) at 08/17/14 1531 Last data filed at 08/17/14 1500  Gross per 24 hour  Intake 4512.56 ml  Output    320 ml  Net 4192.56 ml     Vitals Filed Vitals:   08/17/14 1200 08/17/14 1300 08/17/14 1400 08/17/14 1500  BP:      Pulse: 49   38  Temp:      TempSrc:      Resp: 37 46 39 39  Height:      Weight:      SpO2: 83% 85%  75%    Exam: General: lethargic, resp distress  Lungs: coarse breath sounds, tahcypenic on  BiPAP Cardiovascular: Regular rate and rhythm without murmur gallop or rub normal S1 and S2- tahcycardic Abdomen: Nontender, nondistended, soft, bowel sounds positive, no rebound, no ascites, no appreciable mass Extremities: No significant cyanosis, clubbing, or edema bilateral lower extremities  Data Reviewed: Basic Metabolic Panel:  Recent Labs Lab 08/18/2014 1915 08/16/14 0221 08/16/14 0745 08/16/14 1538 08/17/14 0309 08/17/14 1200  NA 170* 174* 174* 170* 165* 160*  K 3.2* 4.6 4.3 3.1* 2.9* 3.5  CL >130* >130* >130* >130* >130* >130*  CO2 20 18* 16* 17* 17* 14*  GLUCOSE 213* 128* 116* 190* 273* 292*  BUN 67* 55* 52* 51* 43* 43*  CREATININE 1.49* 1.49* 1.61* 1.57* 1.63* 1.48*  CALCIUM 7.9* 7.7* 7.9* 7.8* 7.2* 7.5*  MG 2.1  --   --   --  1.7  --   PHOS 2.2*  --   --   --   --   --     Liver Function Tests:  Recent Labs Lab 08/12/2014 1353 08/16/14 0221 08/17/14 0309  AST 86* 82* 45*  ALT 68* 54* 35  ALKPHOS 212* 155* 110  BILITOT 2.5* 2.5* 1.4*  PROT 7.3 5.7* 4.8*  ALBUMIN 3.3* 2.4* 1.9*   No results for input(s): LIPASE, AMYLASE in the last 168 hours. No results for input(s): AMMONIA in the last 168 hours. CBC:  Recent Labs Lab 08/26/2014 1353 08/16/14 0221 08/17/14 0309  WBC 10.5 8.0 5.8  NEUTROABS 7.7  --   --   HGB 12.8* 11.1* 9.1*  HCT 41.6 34.8* 28.5*  MCV 102.2* 101.2* 98.6  PLT 121* 106* 62*   Cardiac Enzymes:  Recent Labs Lab 08/25/2014 1915 08/16/14 0221 08/16/14 0745  TROPONINI 0.14* 0.15* 0.16*   BNP (last 3 results) No results for input(s): BNP in the last 8760 hours.  ProBNP (last 3 results) No results for input(s): PROBNP in the last 8760 hours.  CBG:  Recent Labs Lab 08/16/14 1555 08/16/14 2224 08/17/14 0829 08/17/14 1344  GLUCAP 168* 190* 241* 232*    Recent Results (from the past 240 hour(s))  Culture, blood (routine x 2)     Status: None (Preliminary result)   Collection Time: 08/08/2014  1:54 PM  Result Value Ref Range Status   Specimen Description BLOOD RIGHT ANTECUBITAL  Final   Special Requests BOTTLES DRAWN AEROBIC AND ANAEROBIC 5ML  Final   Culture   Final           BLOOD CULTURE RECEIVED NO GROWTH TO DATE CULTURE WILL BE HELD FOR 5 DAYS BEFORE ISSUING A FINAL NEGATIVE REPORT Performed at [facility]    Report Status PENDING  Incomplete  Urine culture     Status: None   Collection Time: 08/25/2014  2:09 PM  Result Value Ref Range Status   Specimen Description URINE, CATHETERIZED  Final   Special Requests NONE  Final   Colony Count NO GROWTH Performed at [facility]   Final   Culture NO GROWTH Performed at [facility]   Final   Report Status 08/16/2014 FINAL  Final  Culture, blood (routine x 2)     Status: None (Preliminary result)   Collection Time: 09/01/2014  3:30 PM   Result Value Ref Range Status   Specimen Description BLOOD RIGHT HAND  Final   Special Requests BOTTLES DRAWN AEROBIC ONLY 6CC  Final   Culture   Final           BLOOD CULTURE RECEIVED NO GROWTH TO DATE CULTURE WILL BE  HELD FOR 5 DAYS BEFORE ISSUING A FINAL NEGATIVE REPORT Performed at [facility]    Report Status PENDING  Incomplete  MRSA PCR Screening     Status: None   Collection Time: 08-24-14  6:43 PM  Result Value Ref Range Status   MRSA by PCR NEGATIVE NEGATIVE Final    Comment:        The GeneXpert MRSA Assay (FDA approved for NASAL specimens only), is one component of a comprehensive MRSA colonization surveillance program. It is not intended to diagnose MRSA infection nor to guide or monitor treatment for MRSA infections.   Urine culture     Status: None   Collection Time: 2014-08-24  8:22 PM  Result Value Ref Range Status   Specimen Description URINE, CATHETERIZED  Final   Special Requests NONE  Final   Colony Count NO GROWTH Performed at [facility]   Final   Culture NO GROWTH Performed at [facility]   Final   Report Status 08/17/2014 FINAL  Final     Studies:  Recent x-ray studies have been reviewed in detail by the Attending Physician  Scheduled Meds:  Scheduled Meds: . antiseptic oral rinse  7 mL Mouth Rinse q12n4p  . chlorhexidine  15 mL Mouth Rinse BID  . enoxaparin (LOVENOX) injection  40 mg Subcutaneous Q24H  . insulin aspart  0-9 Units Subcutaneous Q4H  . levETIRAcetam (KEPPRA) IVPB  750 mg Intravenous Q12H  . levothyroxine  50 mcg Intravenous Daily  . piperacillin-tazobactam (ZOSYN)  IV  3.375 g Intravenous 3 times per day  . valproate sodium  500 mg Intravenous Q12H  . vancomycin  1,000 mg Intravenous Q24H   Continuous Infusions: . dextrose 5 % with kcl 75 mL/hr at 08/17/14 1034  . DOPamine 20 mcg/kg/min (08/17/14 1500)    Time spent on care of this patient: 55 min   Osmar Howton, MD 08/17/2014, 3:31 PM   LOS: 2 days   [facility] Office  [phone] Pager - Text Page per [email or web address]  If 7PM-7AM, please contact night-coverage [email or web address]

## 2014-08-17 NOTE — Progress Notes (Signed)
CRITICAL VALUE ALERT  Critical value received:  Cl >130 & Lactic Acid 2.7  Date of notification:  08/17/2014  Time of notification:  13330  Critical value read back:Yes.    Nurse who received alert:  Mike CrazeMegan Shiflett  MD notified (1st page):  Dr. Butler Denmarkizwan  Time of first page:  1335  MD notified (2nd page):  Time of second page:  Responding MD:  Dr. Butler Denmarkizwan  Time MD responded:  1340

## 2014-08-17 NOTE — Progress Notes (Signed)
Chaplain follow up with family regarding priest visitation.  Family not present in room at time of chaplain follow up    Will continue to follow and make contact in morning.   Belva CromeStalnaker, Vearl Aitken [place] MDiv

## 2014-08-17 NOTE — Progress Notes (Signed)
Chaplain responded to referral from SW re: family's desire for [employer] [facility].    Spoke with family at bedside.  They are attempting to contact pt's priest in [place].  However, they have not been able to reach the parish.  Likely due to today being Ash Wednesday.  Chaplain will find out which parish is oncall and alert them of pt's possible need for priest.  Will follow up with family this PM to see if family had been able to contact pt's priest.   Memory ArgueOffered support at bedside and gathered with family to say blessing over Pt.   Belva CromeStalnaker, Buck Mcaffee [place] MDiv

## 2014-08-17 NOTE — Progress Notes (Signed)
Pt off BIPAP at this time and on a 100% NRB. Rt will continue to follow. Pt doing well at this time.

## 2014-08-17 NOTE — Progress Notes (Signed)
Patient's BP 76/34 and text paged MD Rizwan to make aware. Awaiting response and orders.

## 2014-08-17 NOTE — Progress Notes (Addendum)
CSW received notification from [facility] that MD was trying to reach pt guardian regarding pt decline. RNCM had already made multiple attempts to reach guardian from phone number's listed, but MD, RNCM, RN had not been able to reach.   CSW visited pt room. No family present. MD and RN at bedside. RN attempting to reach pt guardian, Ricardo [name], but unsuccessful at this time. CSW contacted pt supervisor, Ricardo [name] via telephone, but unable to reach. CSW left voice message. CSW contacted Ricardo [name] in attempts to reach on call guardian social worker, but person answering the phone was unable to reach on call guardian social worker and was unable to locate anyone in the guardianship department at this time. This CSW was transferred to someone not in the guardianship department who took this CSW contacted information and was making attempts to locate pt guardian in order for pt guardian to contact nursing unit to speak with MD.   CSW discussed with PCCM MD that guardian had sent paperwork state mandated paperwork regarding recommendations for Treatment paperwork (DNR) for pt which had been completed by attending MD, but needed completion by a non-attending MD and PCCM MD agreeable to complete.  CSW received phone call shortly after from Ricardo [name] from [place] who stated that Ricardo [name], [place] guardian was present with her. CSW connected DSS guardian with PCCM MD, Ricardo [name]. MD was able to speak with DSS guardian to explain situation. PCCM MD completed non-attending section of Recommendations for Treatment Paperwork and CSW faxed information to DSS and received confirmation page.   CSW received notification from [place] guardian, Ricardo [name] that DSS also needed statement from pt family stating pt family wishes. Per DSS guardian, guardian spoke in length with pt family regarding the need for this and DSS guardian has emailed pt family with request as well. CSW discussed with pt guardian that pt family had  expressed to this CSW that they wished to speak with MD before writing statement regarding their wishes, but CSW will speak with pt family today to inquire about pt family statement.  CSW noted that pt family arrived to pt room. CSW met with pt family to provide support. Pt family still expressed that they did not feel they had a good understanding of what was happening with pt medically. RN notified PCCM MD in hopes that conversation could be had with pt family. CSW discussed with pt family that [place] guardian is requesting documentation of their wishes. Pt family expressed understanding and are willing to write statement once they are able to speak with MD. CSW expressed understanding. CSW provided clip board and paper for pt family once they feel comfortable with writing statement. DSS guardian stated that statement would need to be witnessed. CSW notified pt family that statement will need to be witnessed when they are completing it.  CSW to follow up with pt family once they are able to speak with MD.   CSW to continue to follow to provide support.  Addendum 1:00 pm:  CSW received phone call from pt guardian, Ricardo [name] stating that the section in the paperwork for Affidavit for Natural Death also has to be completed by attending and non-attending physicians. Also, pt family needs to provide their wishes in writing in order for CSW to fax to pt legal guardian.   CSW contacted nursing unit and notified charge nurse. Per charge nurse, Ricardo [name] has been notified and will fill out paperwork, but Ricardo [name] has left for the day. CSW faxed  the Affidavit for Natural Death paperwork to Ricardo [name] office and unit secretary notified Ricardo [name] office that documentation was being faxed.    CSW met with pt son, Ricardo [name] and daughter, Ricardo [name] at bedside. CSW provided support as pt family discussed that they were able to speak with attending MD, critical care MD, and palliative medicine NP today and feel  they have a better understanding of pt condition. CSW encouraged pt family to document their wishes in order for CSW to fax to pt legal guardian. Pt family expressed that they did not understand why their wishes "mattered" since MD had already made decision to make pt a DNR. CSW explained that although MD has made pt a DNR, the State of [place] still has to have paperwork from MD and pt family. CSW discussed that this is also a way to ensure that pt family wishes are given to legal guardian. Pt family agreeable and will begin writing document for CSW to fax to pt legal guardian.   Pt family expressed to this CSW that pt is Catholic and the pt family is attempting to reach pt [employer], but have been unable to. CSW discussed [employer] and that Chaplain can often help in process of reaching [place] family agreeable to [place] notifying [employer]. CSW contacted Chaplain, Ricardo [name] to notify.   CSW to continue to follow to provide support.   Addendum 5:13 pm:  CSW was able to have Ricardo [name] complete attending physician affidavit for natural death and Ricardo [name] complete non-attending physician affidavit for natural death. Appreciate MD's assistance with completion of paperwork.  CSW met with pt family and pt family completed their statement of their wishes for pt. Pt expressed feeling overwhelmed with writing statement, but recognize that writing statement is their way of communicating to DSS guardian pt wishes. CSW witnessed and RN, Ricardo [name] witnesses pt family signature of statement.   CSW faxed all documents to [place] guardian, Ricardo [name]. CSW contacted DSS guardian, Ricardo [name] via telephone and notified DSS guardian that documents were faxed. DSS guardian stated that she would take documents to her director first thing in the morning.   CSW to continue to follow to provide support.    Alison Murray, MSW, [place] [employer] [phone]

## 2014-08-17 NOTE — Progress Notes (Signed)
ANTIBIOTIC CONSULT NOTE - Follow Up  Pharmacy Consult for Vancomycin and Zosyn Indication: rule out sepsis  Allergies  Allergen Reactions  . Biaxin [Clarithromycin]     Unknown reaction    Patient Measurements: Height: 5\' 7"  (170.2 cm) Weight: 138 lb 10.7 oz (62.9 kg) IBW/kg (Calculated) : 66.1  Vital Signs: Temp: 97.8 F (36.6 C) (02/10 0829) Temp Source: Axillary (02/10 0829) BP: 71/34 mmHg (02/10 0400) Pulse Rate: 74 (02/10 0829) Intake/Output from previous day: 02/09 0701 - 02/10 0700 In: 3685 [I.V.:1347.5; IV Piggyback:2012.5] Out: 480 [Urine:480] Intake/Output from this shift: Total I/O In: 260 [I.V.:160; IV Piggyback:100] Out: 50 [Urine:50]  Labs:  Recent Labs  08/08/2014 1353  08/16/14 0221 08/16/14 0745 08/16/14 1538 08/17/14 0309  WBC 10.5  --  8.0  --   --  5.8  HGB 12.8*  --  11.1*  --   --  9.1*  PLT 121*  --  106*  --   --  62*  CREATININE 1.78*  < > 1.49* 1.61* 1.57* 1.63*  < > = values in this interval not displayed. Estimated Creatinine Clearance: 33.2 mL/min (by C-G formula based on Cr of 1.63). No results for input(s): VANCOTROUGH, VANCOPEAK, VANCORANDOM, GENTTROUGH, GENTPEAK, GENTRANDOM, TOBRATROUGH, TOBRAPEAK, TOBRARND, AMIKACINPEAK, AMIKACINTROU, AMIKACIN in the last 72 hours.   Microbiology: Recent Results (from the past 720 hour(s))  Culture, blood (routine x 2)     Status: None (Preliminary result)   Collection Time: 08/20/2014  1:54 PM  Result Value Ref Range Status   Specimen Description BLOOD RIGHT ANTECUBITAL  Final   Special Requests BOTTLES DRAWN AEROBIC AND ANAEROBIC 5ML  Final   Culture   Final           BLOOD CULTURE RECEIVED NO GROWTH TO DATE CULTURE WILL BE HELD FOR 5 DAYS BEFORE ISSUING A FINAL NEGATIVE REPORT Performed at [facility]    Report Status PENDING  Incomplete  Urine culture     Status: None   Collection Time: 08/19/2014  2:09 PM  Result Value Ref Range Status   Specimen Description URINE, CATHETERIZED   Final   Special Requests NONE  Final   Colony Count NO GROWTH Performed at [facility]   Final   Culture NO GROWTH Performed at [facility]   Final   Report Status 08/16/2014 FINAL  Final  Culture, blood (routine x 2)     Status: None (Preliminary result)   Collection Time: 08/20/2014  3:30 PM  Result Value Ref Range Status   Specimen Description BLOOD RIGHT HAND  Final   Special Requests BOTTLES DRAWN AEROBIC ONLY 6CC  Final   Culture   Final           BLOOD CULTURE RECEIVED NO GROWTH TO DATE CULTURE WILL BE HELD FOR 5 DAYS BEFORE ISSUING A FINAL NEGATIVE REPORT Performed at [facility]    Report Status PENDING  Incomplete  MRSA PCR Screening     Status: None   Collection Time: 08/16/2014  6:43 PM  Result Value Ref Range Status   MRSA by PCR NEGATIVE NEGATIVE Final    Comment:        The GeneXpert MRSA Assay (FDA approved for NASAL specimens only), is one component of a comprehensive MRSA colonization surveillance program. It is not intended to diagnose MRSA infection nor to guide or monitor treatment for MRSA infections.   Urine culture     Status: None   Collection Time: 08/11/2014  8:22 PM  Result Value Ref Range Status   Specimen Description URINE, CATHETERIZED  Final   Special Requests NONE  Final   Colony Count NO GROWTH Performed at [facility]   Final   Culture NO GROWTH Performed at [facility]   Final   Report Status 08/17/2014 FINAL  Final    Medications:  Anti-infectives    Start     Dose/Rate Route Frequency Ordered Stop   08/17/14 1800  vancomycin (VANCOCIN) IVPB 1000 mg/200 mL premix     1,000 mg 200 mL/hr over 60 Minutes Intravenous Every 24 hours 08/17/14 1002     08/14/2014 2200  piperacillin-tazobactam (ZOSYN) IVPB 3.375 g     3.375 g 12.5 mL/hr over 240 Minutes Intravenous 3 times per day 09/04/2014 1832     08/17/2014 2000  vancomycin (VANCOCIN) IVPB 750 mg/150 ml premix  Status:  Discontinued     750  mg 150 mL/hr over 60 Minutes Intravenous Every 24 hours 08/31/2014 1832 08/17/14 1002   08/23/2014 1530  levofloxacin (LEVAQUIN) IVPB 750 mg     750 mg 100 mL/hr over 90 Minutes Intravenous  Once 08/22/2014 1522 08/13/2014 1720   08/26/2014 1530  aztreonam (AZACTAM) 2 g in dextrose 5 % 50 mL IVPB     2 g 100 mL/hr over 30 Minutes Intravenous  Once 08/29/2014 1522 08/14/2014 1808     Assessment: 80yo M from a nursing home w/ lethargy and fever. Given single doses of aztreonam and Levaquin in the ED. Pharmacy is asked to dose Vanc and Zosyn for sepsis, likely urinary source.   2/8 >> Zosyn >> 2/8 >> Vanc >>    Tmax: 101.7 WBCs: wnl Renal: CKD stage II, SCr trending up 1.63, CrCl 33 CG, 38 N  2/8 blood: ngtd 2/8 urine (14:09): NGF 2/8 urine (20:22): NGF  Drug level / dose changes info: 2/10 empirically increase vanc 1g q24h based on new wt and CrCl  Goal of Therapy:  Vancomycin trough level 15-20 mcg/ml  Appropriate antibiotic dosing for renal function; eradication of infection  Plan:   Increase Vancomycin to 1g IV q24h.  Continue Zosyn 3.375g IV Q8H infused over 4hrs.  Measure Vanc trough at steady state.  Follow up renal fxn, culture results, and clinical course.  Loralee Pacas, PharmD, BCPS Pager: [phone] 08/17/2014,10:03 AM.

## 2014-08-17 NOTE — Progress Notes (Addendum)
Rt placed pt back on BIPAP due to respiratory distress. MD and RN at bedside. Pt on 12/10 100% MD aware.

## 2014-08-17 NOTE — Progress Notes (Signed)
CARE MANAGEMENT NOTE 08/17/2014  Patient:  Ricardo [name],Ricardo [name]   Account Number:  [id number]  Date Initiated:  08/16/2014  Documentation initiated by:  DAVIS,RHONDA  Subjective/Objective Assessment:   severe sepsis     Action/Plan:   [facility]   Anticipated DC Date:  08/19/2014   Anticipated DC Plan:  ASSISTED LIVING / REST HOME  In-house referral  Clinical Social Worker      DC Planning Services  CM consult      PAC Choice  NA  NA   Choice offered to / List presented to:  NA   DME arranged  NA      DME agency  NA     HH arranged  NA      HH agency  NA   Status of service:  In process, will continue to follow Medicare Important Message given?   (If response is "NO", the following Medicare IM given date fields will be blank) Date Medicare IM given:   Medicare IM given by:   Date Additional Medicare IM given:   Additional Medicare IM given by:    Discharge Disposition:    Per UR Regulation:  Reviewed for med. necessity/level of care/duration of stay  If discussed at Long Length of Stay Meetings, dates discussed:    Comments:  [id number]/Rhonda Earlene PlaterDavis, RN, BSN, CCN: [phone]/ Case management. Chart reviewed for discharge planning and present needs. Discharge needs: none present at time of review. attempts to reach the legal guardian for question concerning care and next steps failed.  have called x4 and left messages on voice on cell and work phone through out day Next chart review due:  [id number]/tct cell of dontay woolard [phone] and [phone] message left that Dr Henrene PastorMcQuiad needs to speak with the legal guardian concern change in condition. Message was with need to urgently contact [phone]

## 2014-08-17 NOTE — Evaluation (Signed)
SLP Cancellation Note  Patient Details Name: Denton BrickGeorge Pogue MRN: [id number] DOB: [birth year]   Cancelled treatment:       Reason Eval/Treat Not Completed: Medical issues which prohibited therapy (pt with RR in 40s, pt on full NRB, not ready for po) Spoke to [employer] Megan who agrees with deferring evaluation.    Donavan Burnetamara Anirudh Baiz, MS [facility] SLP [phone]

## 2014-08-17 NOTE — Progress Notes (Signed)
Shift event note:  Pt placed on BiPAP at approx 2100 after RN notified regarding pt's increased WOB on NRB despite normal 02 sats. ABG at 1815 revealed pH-7.5, pC02-20.6, p02-46.1 and bicarb of 16. Pt did not appear to tolerate BiPAP as RR remained in the 38-40 range. Pt taken off of BiPAP and appeared to tolerate NRB at 100% after Xopenex nebs x 2. At approx 0300 pt became more hypotensive in setting of persistently soft BP's. SBP dropped to 60's. Pt received a 1L NS IVF bolus w/ minimal improvement. Pt noted resting in NAD, BBS diminished but otherwise CTA. Continues to tolerate NRB.  Discussed pt w/ Dr Deterding w/ Pola CornELINK who rDarrick Pennaecommended placing A-Line to confirm BP's and attempt small IVF boluses. Pt noted w/ diminished UOP (approx 200 cc in 10-11 hrs).  Assessment/Plan: 1. Persistent Hypotension:  Will place A-Line and attempt IVF boluses. If no improvement will re-consult CCM regarding medications for pressure support. Will continue to monitor closely in ICU.  Ricardo ChangKatherine P. Sierah Lacewell, NP-C [facility] Pager [phone]

## 2014-08-17 NOTE — Consult Note (Signed)
Patient Ricardo [name]      DOB: September 21, 1934      GNF:[id number]     Consult Note from the Palliative Medicine Team at [facility]    Consult Requested by: Dr Elisabeth Pigeon     PCP: Oneal Grout, MD Reason for Consultation: Clarification of GOC and options     Phone Number:[phone]  Assessment of patients Current state  Continued physical, functional and cognitive slow decline over the past two years 2/2 to ES-demetia, lives at [facility] and is a ward of the state.  Admitted 2014/08/23 with sepsis likely 2/2 UTI and HCAP.  Continues to decline and need to address advanced directive decision important.  Ultimate decsions need to be addressed with DSS and have had difficulty getting call back.  Consult is for review of medical treatment options, clarification of goals of care and end of life issues, disposition and options, and symptom recommendation.  This NP Lorinda Creed reviewed medical records, received report from team, assessed the patient and then meet at the patient's bedside along with family to include his daughter Corrie Dandy and son Madelaine Bhat, (attempted to contact DSS, no return call)  to discuss diagnosis prognosis, GOC, EOL wishes disposition and options.  A detailed discussion was had today regarding advanced directives.  Concepts specific to code status, artifical feeding and hydration, continued IV antibiotics and rehospitalization was had.  The difference between a aggressive medical intervention path  and a palliative comfort care path for this patient at this time was had.  Values and goals of care important to patient and family were attempted to be elicited.  Concept of Hospice and Palliative Care were discussed  Natural trajectory and expectations at EOL were discussed.  Questions and concerns addressed.  Hard Choices booklet left for review. Family encouraged to call with questions or concerns.  PMT will continue to support holistically.   Goals of Care: 1.  Code Status:  DNR/DNI-previously  documented by attending physician   2. Scope of Treatment: Family understands the overall poor prognosis, but remain hopeful for improvement.   3. Disposition:  Expect decline with  a hospital death  4. Psychosocial:  Emotional support offered to family at bedside. Allowed space and time for sharing of thoughts, feelings and frustrations not only with their father's current medical situation but encounters with DSS.           Brief HPI:   ES dementia with aggressive behavior.  Admitted with sepsis 2/2 to UTI/HCAP.  Continued failure to thrive in spite of medical interventions.  Decisions dependant on DSS input, have not received callback at this time.    ROS: unable to illicit due to  decreased cognition    PMH:  Past Medical History  Diagnosis Date  . Subdural hematoma, post-traumatic   . Chronic indwelling Foley catheter   . Dementia with aggressive behavior   . Frequent falls   . H/O alcohol abuse   . DM (diabetes mellitus)   . HTN (hypertension)   . Sinus tachycardia   . Seizures   . GERD (gastroesophageal reflux disease)      UXL:[id number] reviewed. No pertinent past surgical history. I have reviewed the FH and SH and  If appropriate update it with new information. Allergies  Allergen Reactions  . Biaxin [Clarithromycin]     Unknown reaction    Scheduled Meds: . antiseptic oral rinse  7 mL Mouth Rinse q12n4p  . chlorhexidine  15 mL Mouth Rinse BID  . enoxaparin (LOVENOX) injection  40 mg  Subcutaneous Q24H  . insulin aspart  0-9 Units Subcutaneous Q4H  . levETIRAcetam (KEPPRA) IVPB  750 mg Intravenous Q12H  . levothyroxine  50 mcg Intravenous Daily  . piperacillin-tazobactam (ZOSYN)  IV  3.375 g Intravenous 3 times per day  . valproate sodium  500 mg Intravenous Q12H  . vancomycin  1,000 mg Intravenous Q24H   Continuous Infusions: . dextrose 5 % with kcl 75 mL/hr at 08/17/14 1034  . DOPamine 20 mcg/kg/min (08/17/14 1600)   PRN Meds:.Place/Maintain  arterial line **AND** sodium chloride, levalbuterol, LORazepam, ondansetron **OR** ondansetron (ZOFRAN) IV    BP 100/58 mmHg  Pulse 38  Temp(Src) 97.8 F (36.6 C) (Axillary)  Resp 33  Ht 5\' 7"  (1.702 m)  Wt 62.9 kg (138 lb 10.7 oz)  BMI 21.71 kg/m2  SpO2 52%   PPS: 20 % at best   Intake/Output Summary (Last 24 hours) at 08/17/14 1640 Last data filed at 08/17/14 1600  Gross per 24 hour  Intake 4548.66 ml  Output    395 ml  Net 4153.66 ml     Physical Exam:  General: critically ill appearing, minimally responsive HEENT: moist buccal membranes,   Chest: scattered coarse BS, decreased in bases CVS: RRR  Labs: CBC    Component Value Date/Time   WBC 5.8 08/17/2014 0309   WBC 7.2 11/24/2013   RBC 2.89* 08/17/2014 0309   HGB 9.1* 08/17/2014 0309   HCT 28.5* 08/17/2014 0309   PLT 62* 08/17/2014 0309   MCV 98.6 08/17/2014 0309   MCH 31.5 08/17/2014 0309   MCHC 31.9 08/17/2014 0309   RDW 15.7* 08/17/2014 0309   LYMPHSABS 2.1 09/01/2014 1353   MONOABS 0.7 08/14/2014 1353   EOSABS 0.0 09/04/2014 1353   BASOSABS 0.0 08/17/2014 1353    BMET    Component Value Date/Time   NA 160* 08/17/2014 1200   NA 140 11/24/2013   K 3.5 08/17/2014 1200   CL >130* 08/17/2014 1200   CO2 14* 08/17/2014 1200   GLUCOSE 292* 08/17/2014 1200   BUN 43* 08/17/2014 1200   BUN 25* 11/24/2013   CREATININE 1.48* 08/17/2014 1200   CREATININE 1.0 11/24/2013   CALCIUM 7.5* 08/17/2014 1200   GFRNONAA 44* 08/17/2014 1200   GFRAA 50* 08/17/2014 1200    CMP     Component Value Date/Time   NA 160* 08/17/2014 1200   NA 140 11/24/2013   K 3.5 08/17/2014 1200   CL >130* 08/17/2014 1200   CO2 14* 08/17/2014 1200   GLUCOSE 292* 08/17/2014 1200   BUN 43* 08/17/2014 1200   BUN 25* 11/24/2013   CREATININE 1.48* 08/17/2014 1200   CREATININE 1.0 11/24/2013   CALCIUM 7.5* 08/17/2014 1200   PROT 4.8* 08/17/2014 0309   ALBUMIN 1.9* 08/17/2014 0309   AST 45* 08/17/2014 0309   ALT 35 08/17/2014  0309   ALKPHOS 110 08/17/2014 0309   BILITOT 1.4* 08/17/2014 0309   GFRNONAA 44* 08/17/2014 1200   GFRAA 50* 08/17/2014 1200     Time In Time Out Total Time Spent with Patient Total Overall Time  1230 1400 80 min 90 min    Greater than 50%  of this time was spent counseling and coordinating care related to the above assessment and plan.   Lorinda CreedMary Prosperity Darrough NP  Palliative Medicine Team Team Phone # [phone] Pager [phone]  Discussed with bedside RN and LCSW

## 2014-08-17 NOTE — Progress Notes (Signed)
Pt has had increased lethargy, increasingly pale, is responsive only to sternal rub.Pupils are eqaul round and reactive however they are sluggish. Pt remains unable to follow commands and is not tracking with eyes. MD notified and ABG ordered. BP is now 79/39. BP also relayed to MD and 1L NS bolus ordered.

## 2014-08-18 ENCOUNTER — Inpatient Hospital Stay (HOSPITAL_COMMUNITY): Payer: Medicare Other

## 2014-08-18 DIAGNOSIS — F0391 Unspecified dementia with behavioral disturbance: Secondary | ICD-10-CM

## 2014-08-18 DIAGNOSIS — N182 Chronic kidney disease, stage 2 (mild): Secondary | ICD-10-CM

## 2014-08-18 LAB — BASIC METABOLIC PANEL
BUN: 37 mg/dL — ABNORMAL HIGH (ref 6–23)
CO2: 14 mmol/L — ABNORMAL LOW (ref 19–32)
CREATININE: 1.25 mg/dL (ref 0.50–1.35)
Calcium: 7.8 mg/dL — ABNORMAL LOW (ref 8.4–10.5)
GFR calc non Af Amer: 53 mL/min — ABNORMAL LOW (ref >90)
GFR, EST AFRICAN AMERICAN: 62 mL/min — AB (ref >90)
Glucose, Bld: 267 mg/dL — ABNORMAL HIGH (ref 70–99)
POTASSIUM: 3.6 mmol/L (ref 3.5–5.1)
Sodium: 156 mmol/L — ABNORMAL HIGH (ref 135–145)

## 2014-08-18 LAB — CBC
HEMATOCRIT: 31.6 % — AB (ref 39.0–52.0)
HEMOGLOBIN: 10.2 g/dL — AB (ref 13.0–17.0)
MCH: 30.7 pg (ref 26.0–34.0)
MCHC: 32.3 g/dL (ref 30.0–36.0)
MCV: 95.2 fL (ref 78.0–100.0)
Platelets: 65 10*3/uL — ABNORMAL LOW (ref 150–400)
RBC: 3.32 MIL/uL — ABNORMAL LOW (ref 4.22–5.81)
RDW: 15.7 % — ABNORMAL HIGH (ref 11.5–15.5)
WBC: 9.4 10*3/uL (ref 4.0–10.5)

## 2014-08-18 LAB — GLUCOSE, CAPILLARY
GLUCOSE-CAPILLARY: 162 mg/dL — AB (ref 70–99)
GLUCOSE-CAPILLARY: 195 mg/dL — AB (ref 70–99)
GLUCOSE-CAPILLARY: 196 mg/dL — AB (ref 70–99)
Glucose-Capillary: 211 mg/dL — ABNORMAL HIGH (ref 70–99)
Glucose-Capillary: 215 mg/dL — ABNORMAL HIGH (ref 70–99)
Glucose-Capillary: 149 mg/dL — ABNORMAL HIGH (ref 70–99)
Glucose-Capillary: 186 mg/dL — ABNORMAL HIGH (ref 70–99)

## 2014-08-18 LAB — HEMOGLOBIN A1C
HEMOGLOBIN A1C: 6.8 % — AB (ref 4.8–5.6)
Mean Plasma Glucose: 148 mg/dL

## 2014-08-18 LAB — LACTIC ACID, PLASMA: Lactic Acid, Venous: 2.2 mmol/L (ref 0.5–2.0)

## 2014-08-18 MED ORDER — FUROSEMIDE 10 MG/ML IJ SOLN
20.0000 mg | Freq: Once | INTRAMUSCULAR | Status: AC
  Administered 2014-08-18: 20 mg via INTRAVENOUS
  Filled 2014-08-18: qty 2

## 2014-08-18 NOTE — Progress Notes (Addendum)
[facility] Progress Note   Huntington Leverich ZOX:[id number] DOB: 01-Feb-1935 DOA: 09/04/2014 PCP: Oneal Grout, MD  Brief narrative: Ricardo [name] is a 80 y.o. male from SNF who is a ward of the state admitted with lethargy and found to have sepsis, hypoxia, UTI, pneumonia and severe hypernatremia. His children have spoken with me and state that when they went to see him 3 days ago, he had a cough, appears sick and they requested he be admitted to the hospital on that day.    Subjective: More alert today - continues to be on BiPAP  Assessment/Plan: Principal Problem:   Septic shock - given numerous fluid boluses without improvement in BP- Dopamine started- BP improved for now (A) UTI (B) HCAP Cont Vanc and Zosyn for coverage of above- f/u cultures Cont Dopamine infusion to keep SBP > 90  Active Problems:   Acute respiratory failure with hypoxia - due to HCAP and now due to pulm edema from aggressive fluid resuscitation yesterday - less consolidation noted on CXR today - will give small doses of Lasix to help improve resp status and hopefully wean off of BiPAP over the next 24 hrs. - note cont D5 W along with Lasix to help resolve hypernatremia -do not intubate- Dr Kendrick Fries discussed this with family- we have signed appropriate paperwork to make him a DNR as we are unable to reach his  Legal guardian.   Hypokalemia - being replaced via IV- cont to follow now that we will be giving lasix  Hyperglycemia - due to D5?- may have underlying DM-  A1c pending- cont sliding scale    Hypothyroidism - cont synthroid IV  Seizure disorder - cont IV Keppra    Hypertensive heart and renal disease - currently hypotensive- follow BP carefully with A-line    AKI- Chronic kidney disease, stage II (mild)/ hypernatremia - due to severe dehydration- has received aggressive hydration- currently on D5 W- lasix being started    Dementia with psychosis - unable to resume home meds due to  lethargy  Code Status: DNR Family Communication: daughter and son Disposition Plan: critically ill DVT prophylaxis: Lovenox  Consultants: PCCM  Procedures:   Antibiotics: Anti-infectives    Start     Dose/Rate Route Frequency Ordered Stop   08/17/14 1800  vancomycin (VANCOCIN) IVPB 1000 mg/200 mL premix     1,000 mg 200 mL/hr over 60 Minutes Intravenous Every 24 hours 08/17/14 1002     08/29/2014 2200  piperacillin-tazobactam (ZOSYN) IVPB 3.375 g     3.375 g 12.5 mL/hr over 240 Minutes Intravenous 3 times per day 09/02/2014 1832     09/04/2014 2000  vancomycin (VANCOCIN) IVPB 750 mg/150 ml premix  Status:  Discontinued     750 mg 150 mL/hr over 60 Minutes Intravenous Every 24 hours 08/16/2014 1832 08/17/14 1002   09/01/2014 1530  levofloxacin (LEVAQUIN) IVPB 750 mg     750 mg 100 mL/hr over 90 Minutes Intravenous  Once 08/20/2014 1522 08/16/2014 1720   09/04/2014 1530  aztreonam (AZACTAM) 2 g in dextrose 5 % 50 mL IVPB     2 g 100 mL/hr over 30 Minutes Intravenous  Once 08/22/2014 1522 08/13/2014 1808         Objective: Filed Weights   08/20/2014 1820 08/16/14 0500 08/17/14 0500  Weight: 57.6 kg (126 lb 15.8 oz) 58.9 kg (129 lb 13.6 oz) 62.9 kg (138 lb 10.7 oz)    Intake/Output Summary (Last 24 hours) at 08/18/14 8119 Last data filed at 08/18/14 0800  Gross per 24 hour  Intake 3768.06 ml  Output    885 ml  Net 2883.06 ml     Vitals Filed Vitals:   08/18/14 0500 08/18/14 0600 08/18/14 0700 08/18/14 0800  BP:      Pulse: 90 91 90 85  Temp:    97.1 F (36.2 C)  TempSrc:    Axillary  Resp: 37 38 38 37  Height:      Weight:      SpO2: 98% 99% 100% 95%    Exam: General: alert resp distress  Lungs: coarse breath sounds, tahcypenic on BiPAP- RR in 40s today Cardiovascular: Regular rate and rhythm without murmur gallop or rub normal S1 and S2- no longer tahcycardic Abdomen: Nontender, nondistended, soft, bowel sounds positive, no rebound, no ascites, no appreciable  mass Extremities: No significant cyanosis, clubbing, or edema bilateral lower extremities  Data Reviewed: Basic Metabolic Panel:  Recent Labs Lab 06/16/2015 1915  08/16/14 0745 08/16/14 1538 08/17/14 0309 08/17/14 1200 08/18/14 0349  NA 170*  < > 174* 170* 165* 160* 156*  K 3.2*  < > 4.3 3.1* 2.9* 3.5 3.6  CL >130*  < > >130* >130* >130* >130* >130*  CO2 20  < > 16* 17* 17* 14* 14*  GLUCOSE 213*  < > 116* 190* 273* 292* 267*  BUN 67*  < > 52* 51* 43* 43* 37*  CREATININE 1.49*  < > 1.61* 1.57* 1.63* 1.48* 1.25  CALCIUM 7.9*  < > 7.9* 7.8* 7.2* 7.5* 7.8*  MG 2.1  --   --   --  1.7  --   --   PHOS 2.2*  --   --   --   --   --   --   < > = values in this interval not displayed. Liver Function Tests:  Recent Labs Lab 06/16/2015 1353 08/16/14 0221 08/17/14 0309  AST 86* 82* 45*  ALT 68* 54* 35  ALKPHOS 212* 155* 110  BILITOT 2.5* 2.5* 1.4*  PROT 7.3 5.7* 4.8*  ALBUMIN 3.3* 2.4* 1.9*   No results for input(s): LIPASE, AMYLASE in the last 168 hours. No results for input(s): AMMONIA in the last 168 hours. CBC:  Recent Labs Lab 06/16/2015 1353 08/16/14 0221 08/17/14 0309 08/18/14 0349  WBC 10.5 8.0 5.8 9.4  NEUTROABS 7.7  --   --   --   HGB 12.8* 11.1* 9.1* 10.2*  HCT 41.6 34.8* 28.5* 31.6*  MCV 102.2* 101.2* 98.6 95.2  PLT 121* 106* 62* 65*   Cardiac Enzymes:  Recent Labs Lab 06/16/2015 1915 08/16/14 0221 08/16/14 0745  TROPONINI 0.14* 0.15* 0.16*   BNP (last 3 results) No results for input(s): BNP in the last 8760 hours.  ProBNP (last 3 results) No results for input(s): PROBNP in the last 8760 hours.  CBG:  Recent Labs Lab 08/17/14 1722 08/17/14 1938 08/17/14 2332 08/18/14 0337 08/18/14 0725  GLUCAP 180* 209* 195* 211* 215*    Recent Results (from the past 240 hour(s))  Culture, blood (routine x 2)     Status: None (Preliminary result)   Collection Time: 06/16/2015  1:54 PM  Result Value Ref Range Status   Specimen Description BLOOD RIGHT  ANTECUBITAL  Final   Special Requests BOTTLES DRAWN AEROBIC AND ANAEROBIC 5ML  Final   Culture   Final           BLOOD CULTURE RECEIVED NO GROWTH TO DATE CULTURE WILL BE HELD FOR 5 DAYS BEFORE ISSUING A FINAL NEGATIVE REPORT  Performed at [facility]    Report Status PENDING  Incomplete  Urine culture     Status: None   Collection Time: 08/25/2014  2:09 PM  Result Value Ref Range Status   Specimen Description URINE, CATHETERIZED  Final   Special Requests NONE  Final   Colony Count NO GROWTH Performed at [facility]   Final   Culture NO GROWTH Performed at [facility]   Final   Report Status 08/16/2014 FINAL  Final  Culture, blood (routine x 2)     Status: None (Preliminary result)   Collection Time: 08/22/2014  3:30 PM  Result Value Ref Range Status   Specimen Description BLOOD RIGHT HAND  Final   Special Requests BOTTLES DRAWN AEROBIC ONLY 6CC  Final   Culture   Final           BLOOD CULTURE RECEIVED NO GROWTH TO DATE CULTURE WILL BE HELD FOR 5 DAYS BEFORE ISSUING A FINAL NEGATIVE REPORT Performed at [facility]    Report Status PENDING  Incomplete  MRSA PCR Screening     Status: None   Collection Time: 08/08/2014  6:43 PM  Result Value Ref Range Status   MRSA by PCR NEGATIVE NEGATIVE Final    Comment:        The GeneXpert MRSA Assay (FDA approved for NASAL specimens only), is one component of a comprehensive MRSA colonization surveillance program. It is not intended to diagnose MRSA infection nor to guide or monitor treatment for MRSA infections.   Urine culture     Status: None   Collection Time: 08/08/2014  8:22 PM  Result Value Ref Range Status   Specimen Description URINE, CATHETERIZED  Final   Special Requests NONE  Final   Colony Count NO GROWTH Performed at [facility]   Final   Culture NO GROWTH Performed at [facility]   Final   Report Status 08/17/2014 FINAL  Final     Studies:  Recent x-ray studies  have been reviewed in detail by the Attending Physician  Scheduled Meds:  Scheduled Meds: . antiseptic oral rinse  7 mL Mouth Rinse q12n4p  . chlorhexidine  15 mL Mouth Rinse BID  . enoxaparin (LOVENOX) injection  40 mg Subcutaneous Q24H  . furosemide  20 mg Intravenous Once  . insulin aspart  0-9 Units Subcutaneous Q4H  . levETIRAcetam (KEPPRA) IVPB  750 mg Intravenous Q12H  . levothyroxine  50 mcg Intravenous Daily  . piperacillin-tazobactam (ZOSYN)  IV  3.375 g Intravenous 3 times per day  . valproate sodium  500 mg Intravenous Q12H  . vancomycin  1,000 mg Intravenous Q24H   Continuous Infusions: . dextrose 5 % with kcl 125 mL/hr at 08/17/14 2127  . DOPamine 4 mcg/kg/min (08/18/14 0800)    Time spent on care of this patient: 55 min   Sanyia Dini, MD 08/18/2014, 8:32 AM  LOS: 3 days   [facility] Office  [phone] Pager - Text Page per [email or web address]  If 7PM-7AM, please contact night-coverage [email or web address]

## 2014-08-18 NOTE — Progress Notes (Signed)
[facility] PCCM  S: Mr. Kathaleen BuryMatton's shock has improved today, he remains in respiratory distress O:  Filed Vitals:   08/18/14 0700 08/18/14 0800 08/18/14 0845 08/18/14 0900  BP:   100/53   Pulse: 90 85 88 90  Temp:  97.1 F (36.2 C)    TempSrc:  Axillary    Resp: 38 37 42 41  Height:      Weight:      SpO2: 100% 95% 91% 97%   Gen: tachypneic on BIPAP HEENT: NCAT, BIPAP mask PULM: crackles bilaterally CV: Tachy, regular, no mgr AB: BS+, soft Ext: edema noted Neuro: somonolent, stirs to touch  Impression: 1) Septic shock 2) HCAP 3) UTI 4) Acute respiratory failure due to HCAP and pulmonary edema 5) Dementia  Discussion: shock somewhat improved today, but appears to be in more respiratory distress which is likely due to pulmonary edema  Plan: Agree with diuresis Continue BIPAP If no improvement, would move to full comfort measures with morphine gtt  Code status DNR  PCCM will sign off, call if questions  Heber CarolinaBrent Merly Hinkson, MD [place] [facility] Pager: [phone] Cell: [phone] If no response, call [phone]

## 2014-08-18 NOTE — Evaluation (Signed)
SLP Cancellation Note  Patient Details Name: Ricardo [name] MRN: [id number] DOB: 06-06-1935   Cancelled treatment:       Reason Eval/Treat Not Completed: Medical issues which prohibited therapy   Donavan Burnetamara Janda Cargo, MS [facility] SLP [phone]

## 2014-08-18 NOTE — Progress Notes (Signed)
Pt currently on NRB and tolerating well at this time, Pt in no apparent respiratory distress.  RT will hold BIPAP for now.  RT to monitor and assess as needed.

## 2014-08-18 NOTE — Progress Notes (Signed)
Inpatient Diabetes Program Recommendations  AACE/ADA: New Consensus Statement on Inpatient Glycemic Control (2013)  Target Ranges:  Prepandial:   less than 140 mg/dL      Peak postprandial:   less than 180 mg/dL (1-2 hours)      Critically ill patients:  140 - 180 mg/dL     Results for Ricardo [name], Ricardo [name] (MRN [id number]) as of 08/18/2014 10:45  Ref. Range 08/17/2014 08:29 08/17/2014 13:44 08/17/2014 17:22 08/17/2014 19:38  Glucose-Capillary Latest Range: 70-99 mg/dL 696241 (H) 295232 (H) 284180 (H) 209 (H)    Results for Ricardo [name], Ricardo [name] (MRN [id number]) as of 08/18/2014 10:45  Ref. Range 08/17/2014 23:32 08/18/2014 03:37 08/18/2014 07:25  Glucose-Capillary Latest Range: 70-99 mg/dL 725195 (H) 366211 (H) 440215 (H)    Chief Complaint: AMS  HPI:  80 yo male from SNF, with severe dementia and limited mobility, mostly bed bound per reports, legal [facility]Dontay [facility], [facility] after noted to be minimally responsive and with poor oral intake for several days. Pt is unable to provide any history due to acute illness and underlying dementia and most of the details obtained from available records and ED doctor. Pt has had several episodes of hypoxia but no clear documentation of how low oxygen sat's were.    **Per Dr. Izola PriceMyers' H&P note, patient does have History of DM: Past Medical History  Diagnosis Date  . Subdural hematoma, post-traumatic   . Chronic indwelling Foley catheter   . Dementia with aggressive behavior   . Frequent falls   . H/O alcohol abuse   . DM (diabetes mellitus)   . HTN (hypertension)   . Sinus tachycardia   . Seizures   . GERD (gastroesophageal reflux disease)          Current Orders: Novolog Sensitive SSI Q4 hours    MD- If within goals of care for this patient, please consider adding low dose basal insulin if patient continues to have hyperglycemia-  Lantus 10 units QHS (0.15 units/kg dosing)    Will  follow Ambrose FinlandJeannine Johnston Izabela Ow RN, MSN, CDE Diabetes Coordinator Inpatient Diabetes Program Team Pager: [phone] (8a-10p)

## 2014-08-18 NOTE — Progress Notes (Addendum)
CSW continuing to follow.  CSW received notification from pt guardian, Ricardo [name] this morning that they received the Recommendation for Treatment paperwork and pt family statement. Per DSS guardian, Ricardo [name] paperwork was presented to Interior and spatial designerdirector of DSS and Interior and spatial designerdirector of DSS has reviewed information and concurs with decision for pt to be DNR.   Per legal guardian with DSS, medical team will need to continue to discuss with legal guardian pt care in order for decisions to be made for pt. Pt guardian states that she can be reach at [phone].  CSW to continue to follow to provide support.  Addendum 3:00 pm:  CSW visited pt room. Pt son and pt daughter-in-law present at bedside. CSW provided support as pt son discussed that he feels pt has had some improvements, but pt son feels pt cognitively is worse. Pt son inquired if MD had rounded on pt and CSW discussed that MD had note in from earlier today. Pt son expressed that he wishes to speak with MD to clarify questions. CSW paged attending MD to notify.   CSW notified pt son and pt daughter-in-law that DSS agreed with MD recommendation for pt to remain DNR. Pt son and pt family are in agreement with this. Pt family inquired if DSS guardian has spoken any further about pt treatment, etc. CSW discussed that MD's have not yet had any further discussion about pt care with DSS guardian per CSW knowledge at this time.   CSW to continue to follow to provide support.   [name], MSW, LCSW Clinical Social Work [phone]

## 2014-08-19 ENCOUNTER — Inpatient Hospital Stay (HOSPITAL_COMMUNITY): Payer: Medicare Other

## 2014-08-19 DIAGNOSIS — R0600 Dyspnea, unspecified: Secondary | ICD-10-CM

## 2014-08-19 DIAGNOSIS — Z7189 Other specified counseling: Secondary | ICD-10-CM

## 2014-08-19 DIAGNOSIS — N390 Urinary tract infection, site not specified: Secondary | ICD-10-CM

## 2014-08-19 DIAGNOSIS — Z515 Encounter for palliative care: Secondary | ICD-10-CM

## 2014-08-19 LAB — CBC
HCT: 33.9 % — ABNORMAL LOW (ref 39.0–52.0)
HEMOGLOBIN: 11.1 g/dL — AB (ref 13.0–17.0)
MCH: 30.7 pg (ref 26.0–34.0)
MCHC: 32.7 g/dL (ref 30.0–36.0)
MCV: 93.9 fL (ref 78.0–100.0)
Platelets: 89 10*3/uL — ABNORMAL LOW (ref 150–400)
RBC: 3.61 MIL/uL — AB (ref 4.22–5.81)
RDW: 15.9 % — ABNORMAL HIGH (ref 11.5–15.5)
WBC: 17.5 10*3/uL — ABNORMAL HIGH (ref 4.0–10.5)

## 2014-08-19 LAB — BASIC METABOLIC PANEL
Anion gap: 6 (ref 5–15)
BUN: 31 mg/dL — ABNORMAL HIGH (ref 6–23)
CO2: 16 mmol/L — ABNORMAL LOW (ref 19–32)
Calcium: 7.9 mg/dL — ABNORMAL LOW (ref 8.4–10.5)
Chloride: 129 mmol/L — ABNORMAL HIGH (ref 96–112)
Creatinine, Ser: 1.38 mg/dL — ABNORMAL HIGH (ref 0.50–1.35)
GFR, EST AFRICAN AMERICAN: 55 mL/min — AB (ref >90)
GFR, EST NON AFRICAN AMERICAN: 47 mL/min — AB (ref >90)
GLUCOSE: 165 mg/dL — AB (ref 70–99)
Potassium: 3.4 mmol/L — ABNORMAL LOW (ref 3.5–5.1)
SODIUM: 151 mmol/L — AB (ref 135–145)

## 2014-08-19 LAB — GLUCOSE, CAPILLARY
GLUCOSE-CAPILLARY: 139 mg/dL — AB (ref 70–99)
GLUCOSE-CAPILLARY: 147 mg/dL — AB (ref 70–99)
GLUCOSE-CAPILLARY: 157 mg/dL — AB (ref 70–99)
Glucose-Capillary: 152 mg/dL — ABNORMAL HIGH (ref 70–99)
Glucose-Capillary: 155 mg/dL — ABNORMAL HIGH (ref 70–99)

## 2014-08-19 LAB — VANCOMYCIN, TROUGH: Vancomycin Tr: 17.1 ug/mL (ref 10.0–20.0)

## 2014-08-19 MED ORDER — FUROSEMIDE 10 MG/ML IJ SOLN
20.0000 mg | Freq: Once | INTRAMUSCULAR | Status: AC
  Administered 2014-08-19: 20 mg via INTRAVENOUS
  Filled 2014-08-19: qty 2

## 2014-08-19 MED ORDER — POTASSIUM CHLORIDE 10 MEQ/100ML IV SOLN
10.0000 meq | INTRAVENOUS | Status: AC
  Administered 2014-08-19 (×4): 10 meq via INTRAVENOUS
  Filled 2014-08-19 (×4): qty 100

## 2014-08-19 NOTE — Progress Notes (Signed)
Pt breathing more labored than all pm-RR 40-50s and pt grunting with each breath.  Pt appears to be attempting to clear secretions but unable.  Elink MD notified and order received to NTS suction.  Pt suctioned x3 with return of thick tan, blood tinged mucous.  Pt breathing slightly eased but RR and HR remain elevated from baseline.  Prn ativan given for distress.  Pt breathing remained labored and RR 50s. Bipap applied and pt appears to be more comfortable now.  Will continue to monitor closely.

## 2014-08-19 NOTE — Progress Notes (Addendum)
Pt currently on NRB and tolerating fairly well at this time, Pt is slightly tachypneic but has been throughout the day even on BIPAP.  Pt is resting at this time and RN and RT has agreed to leave pt off BIPAP at this time.  RN to notify RT if Pt needs BIPAP throughout the night.  RT to monitor and assess as needed.

## 2014-08-19 NOTE — Progress Notes (Signed)
ANTIBIOTIC CONSULT NOTE - Follow Up  Pharmacy Consult for Vancomycin and Zosyn Indication: rule out sepsis  Allergies  Allergen Reactions  . Biaxin [Clarithromycin]     Unknown reaction    Patient Measurements: Height: 5\' 7"  (170.2 cm) Weight: 138 lb 10.7 oz (62.9 kg) IBW/kg (Calculated) : 66.1  Vital Signs: Temp: 100.1 F (37.8 C) (02/12 1200) Temp Source: Oral (02/12 1200) BP: 92/60 mmHg (02/12 1400) Pulse Rate: 39 (02/12 1400) Intake/Output from previous day: 02/11 0701 - 02/12 0700 In: 2677.1 [I.V.:2039.6; IV Piggyback:637.5] Out: 2090 [Urine:2090] Intake/Output from this shift: Total I/O In: 946.5 [I.V.:546.5; IV Piggyback:400] Out: 495 [Urine:495]  Labs:  Recent Labs  08/17/14 0309 08/17/14 1200 08/18/14 0349 08/19/14 0430  WBC 5.8  --  9.4 17.5*  HGB 9.1*  --  10.2* 11.1*  PLT 62*  --  65* 89*  CREATININE 1.63* 1.48* 1.25 1.38*   Estimated Creatinine Clearance: 39.2 mL/min (by C-G formula based on Cr of 1.38). No results for input(s): VANCOTROUGH, VANCOPEAK, VANCORANDOM, GENTTROUGH, GENTPEAK, GENTRANDOM, TOBRATROUGH, TOBRAPEAK, TOBRARND, AMIKACINPEAK, AMIKACINTROU, AMIKACIN in the last 72 hours.   Microbiology: Recent Results (from the past 720 hour(s))  Culture, blood (routine x 2)     Status: None (Preliminary result)   Collection Time: 12/17/14  1:54 PM  Result Value Ref Range Status   Specimen Description BLOOD RIGHT ANTECUBITAL  Final   Special Requests BOTTLES DRAWN AEROBIC AND ANAEROBIC 5ML  Final   Culture   Final           BLOOD CULTURE RECEIVED NO GROWTH TO DATE CULTURE WILL BE HELD FOR 5 DAYS BEFORE ISSUING A FINAL NEGATIVE REPORT [facility]formed at [facility]    Report Status PENDING  Incomplete  Urine culture     Status: None   Collection Time: 12/17/14  2:09 PM  Result Value Ref Range Status   Specimen Description URINE, CATHETERIZED  Final   Special Requests NONE  Final   Colony Count NO GROWTH Performed at [facility]   Final   Culture NO GROWTH Performed at [facility]   Final   Report Status 08/16/2014 FINAL  Final  Culture, blood (routine x 2)     Status: None (Preliminary result)   Collection Time: 12/17/14  3:30 PM  Result Value Ref Range Status   Specimen Description BLOOD RIGHT HAND  Final   Special Requests BOTTLES DRAWN AEROBIC ONLY 6CC  Final   Culture   Final           BLOOD CULTURE RECEIVED NO GROWTH TO DATE CULTURE WILL BE HELD FOR 5 DAYS BEFORE ISSUING A FINAL NEGATIVE REPORT Performed at [facility]    Report Status PENDING  Incomplete  MRSA PCR Screening     Status: None   Collection Time: 12/17/14  6:43 PM  Result Value Ref Range Status   MRSA by PCR NEGATIVE NEGATIVE Final    Comment:        The GeneXpert MRSA Assay (FDA approved for NASAL specimens only), is one component of a comprehensive MRSA colonization surveillance program. It is not intended to diagnose MRSA infection nor to guide or monitor treatment for MRSA infections.   Urine culture     Status: None   Collection Time: 12/17/14  8:22 PM  Result Value Ref Range Status   Specimen Description URINE, CATHETERIZED  Final   Special Requests NONE  Final   Colony Count NO GROWTH Performed at [facility]  Final   Culture NO GROWTH Performed at [facility]   Final   Report Status 08/17/2014 FINAL  Final    Medications:  Anti-infectives    Start     Dose/Rate Route Frequency Ordered Stop   08/17/14 1800  vancomycin (VANCOCIN) IVPB 1000 mg/200 mL premix     1,000 mg 200 mL/hr over 60 Minutes Intravenous Every 24 hours 08/17/14 1002     08/17/2014 2200  piperacillin-tazobactam (ZOSYN) IVPB 3.375 g     3.375 g 12.5 mL/hr over 240 Minutes Intravenous 3 times per day 08/22/2014 1832     08/14/2014 2000  vancomycin (VANCOCIN) IVPB 750 mg/150 ml premix  Status:  Discontinued     750 mg 150 mL/hr over 60 Minutes Intravenous Every 24 hours 08/31/2014 1832 08/17/14 1002    08/20/2014 1530  levofloxacin (LEVAQUIN) IVPB 750 mg     750 mg 100 mL/hr over 90 Minutes Intravenous  Once 08/28/2014 1522 08/28/2014 1720   08/27/2014 1530  aztreonam (AZACTAM) 2 g in dextrose 5 % 50 mL IVPB     2 g 100 mL/hr over 30 Minutes Intravenous  Once 08/12/2014 1522 09/03/2014 1808     Assessment: 80yo M from a nursing home w/ lethargy and fever. Given single doses of aztreonam and Levaquin in the ED. Pharmacy is asked to dose Vanc and Zosyn for sepsis, likely urinary source.   2/8 >> Zosyn >> 2/8 >> Vanc >>    Tmax: 100.1 WBCs: wnl-> 17.5K Renal: CKD stage II, SCr 1.38, Cl ~ 45N  2/8 blood: ngtd 2/8 urine (14:09): NGF 2/8 urine (20:22): NGF  Drug level / dose changes info: 2/10 empirically increase vanc 1g q24h based on new wt and CrCl  Goal of Therapy:  Vancomycin trough level 15-20 mcg/ml  Appropriate antibiotic dosing for renal function; eradication of infection  Plan:   Continue Vancomycin to 1g IV q24h.  Continue Zosyn 3.375g IV Q8H infused over 4hrs.  Measure Vanc trough at steady state today at 1700  Follow up renal fxn, culture results, and clinical course.  Otho Bellows PharmD Pager [phone] 08/19/2014, 3:00 PM

## 2014-08-19 NOTE — Progress Notes (Addendum)
Pt's legal guardian, Ricardo [name], came by this morning to get an update. Guardian updated and said that the doctors can contact her regarding "the next step" and "plan from here". MD paged regarding guardian update. Family arrived and requested MD be paged to talk about plan of care. MD paged and returned call. Family at bedside unable to take MD call as they were both in the middle of phone conversations.

## 2014-08-19 NOTE — Progress Notes (Signed)
ANTIBIOTIC CONSULT NOTE - Follow Up  Pharmacy Consult for Vancomycin Indication: rule out sepsis  Brief Pharmacy note Please see note today from Perlie Golderri Green, PharmD for full details  Assessment: 80yo M from a nursing home w/ lethargy and fever. Given single doses of aztreonam and Levaquin in the ED. Pharmacy is asked to dose Vanc and Zosyn for sepsis, likely urinary source.   Drug level / dose changes info: 2/12: 1700 VT = 17.1 on 1g q24h, continue  Plan - continue vancomycin 1g IV q24h - follow-up clinical course, culture results, renal function - follow-up antibiotic de-escalation and length of therapy  Thank you for the consult.  Ricardo [name], PharmD, BCPS Pager: [phone] Pharmacy: [phone] 08/19/2014 6:56 PM

## 2014-08-19 NOTE — Evaluation (Signed)
  SLP Cancellation Note  Patient Details Name: Ricardo [name] MRN: [id number] DOB: Mar 28, 1935   Cancelled treatment:       Reason Eval/Treat Not Completed: Medical issues which prohibited therapy (will sign off due to pt's continued respiratory issues, please reorder if desire) [name], MS [facility] SLP [phone]

## 2014-08-19 NOTE — Progress Notes (Signed)
[facility] Progress Note   Ricardo [name] DGU:[id number] DOB: 1934/09/20 DOA: 08/27/2014 PCP: Oneal Grout, MD  Brief narrative: Ricardo [name] is a 80 y.o. male from SNF who is a ward of the state admitted with lethargy and found to have sepsis, hypoxia, UTI, pneumonia and severe hypernatremia. His children have spoken with me and state that when they went to see him 3 days ago, he had a cough, appears sick and they requested he be admitted to the hospital on that day.    Subjective: Became more short of breath last night and needed NT suctioning. Continues to be in resp distress on BiPAP. Not very alert.   Assessment/Plan: Principal Problem:   Septic shock - given numerous fluid boluses without improvement in BP- Dopamine started- BP improved for now (A) UTI (B) HCAP Cont Vanc and Zosyn for coverage of above- Cultures NGTD Cont Dopamine infusion to keep SBP > 90- currently requiring 10 mcg of Dopamine  Active Problems:   Acute respiratory failure with hypoxia - due to HCAP and now due to pulm edema from aggressive fluid resuscitation yesterday - will give another small doses of Lasix to help improve resp status and hopefully wean off of BiPAP over the next 24 hrs. - note: cont slow D5 W along with Lasix to help resolve hypernatremia -do not intubate- Dr Kendrick Fries discussed this with family- we have signed appropriate paperwork to make him a DNR - his legal guardian has been notified by Dr Kendrick Fries   Hypokalemia - being replaced via IV- cont to follow now that we will be giving lasix  DM 2  - new diagonsis - A1c 6.8- cont sliding scale    Hypothyroidism - cont synthroid IV  Seizure disorder - cont IV Keppra    Hypertensive heart and renal disease - currently hypotensive- follow BP carefully with A-line    AKI- Chronic kidney disease, stage II (mild)/ hypernatremia - due to severe dehydration- has received aggressive hydration- currently on D5 W- lasix being  started    Dementia with psychosis - unable to resume home meds due to lethargy  Code Status: DNR Family Communication: daughter and son Disposition Plan: critically ill DVT prophylaxis: Lovenox  Consultants: PCCM  Procedures:   Antibiotics: Anti-infectives    Start     Dose/Rate Route Frequency Ordered Stop   08/17/14 1800  vancomycin (VANCOCIN) IVPB 1000 mg/200 mL premix     1,000 mg 200 mL/hr over 60 Minutes Intravenous Every 24 hours 08/17/14 1002     08/26/2014 2200  piperacillin-tazobactam (ZOSYN) IVPB 3.375 g     3.375 g 12.5 mL/hr over 240 Minutes Intravenous 3 times per day 09/02/2014 1832     08/12/2014 2000  vancomycin (VANCOCIN) IVPB 750 mg/150 ml premix  Status:  Discontinued     750 mg 150 mL/hr over 60 Minutes Intravenous Every 24 hours 08/08/2014 1832 08/17/14 1002   08/08/2014 1530  levofloxacin (LEVAQUIN) IVPB 750 mg     750 mg 100 mL/hr over 90 Minutes Intravenous  Once 08/20/2014 1522 08/28/2014 1720   08/08/2014 1530  aztreonam (AZACTAM) 2 g in dextrose 5 % 50 mL IVPB     2 g 100 mL/hr over 30 Minutes Intravenous  Once 09/04/2014 1522 08/12/2014 1808         Objective: Filed Weights   08/08/2014 1820 08/16/14 0500 08/17/14 0500  Weight: 57.6 kg (126 lb 15.8 oz) 58.9 kg (129 lb 13.6 oz) 62.9 kg (138 lb 10.7 oz)    Intake/Output Summary (  Last 24 hours) at 08/19/14 0924 Last data filed at 08/19/14 0800  Gross per 24 hour  Intake 2476.35 ml  Output   2040 ml  Net 436.35 ml     Vitals Filed Vitals:   08/19/14 0448 08/19/14 0615 08/19/14 0707 08/19/14 0800  BP: 70/45 95/41 94/56    Pulse: 39 41 116 41  Temp:      TempSrc:      Resp: 39 41  41  Height:      Weight:      SpO2: 85% 65%  60%    Exam: General: alert resp distress  Lungs: coarse breath sounds, tahcypenic on BiPAP- RR in 40s today Cardiovascular: Regular rate and rhythm without murmur gallop or rub normal S1 and S2- no longer tahcycardic Abdomen: Nontender, nondistended, soft, bowel sounds  positive, no rebound, no ascites, no appreciable mass Extremities: No significant cyanosis, clubbing, or edema bilateral lower extremities  Data Reviewed: Basic Metabolic Panel:  Recent Labs Lab 05/04/2015 1915  08/16/14 1538 08/17/14 0309 08/17/14 1200 08/18/14 0349 08/19/14 0430  NA 170*  < > 170* 165* 160* 156* 151*  K 3.2*  < > 3.1* 2.9* 3.5 3.6 3.4*  CL >130*  < > >130* >130* >130* >130* 129*  CO2 20  < > 17* 17* 14* 14* 16*  GLUCOSE 213*  < > 190* 273* 292* 267* 165*  BUN 67*  < > 51* 43* 43* 37* 31*  CREATININE 1.49*  < > 1.57* 1.63* 1.48* 1.25 1.38*  CALCIUM 7.9*  < > 7.8* 7.2* 7.5* 7.8* 7.9*  MG 2.1  --   --  1.7  --   --   --   PHOS 2.2*  --   --   --   --   --   --   < > = values in this interval not displayed. Liver Function Tests:  Recent Labs Lab 05/04/2015 1353 08/16/14 0221 08/17/14 0309  AST 86* 82* 45*  ALT 68* 54* 35  ALKPHOS 212* 155* 110  BILITOT 2.5* 2.5* 1.4*  PROT 7.3 5.7* 4.8*  ALBUMIN 3.3* 2.4* 1.9*   No results for input(s): LIPASE, AMYLASE in the last 168 hours. No results for input(s): AMMONIA in the last 168 hours. CBC:  Recent Labs Lab 05/04/2015 1353 08/16/14 0221 08/17/14 0309 08/18/14 0349 08/19/14 0430  WBC 10.5 8.0 5.8 9.4 17.5*  NEUTROABS 7.7  --   --   --   --   HGB 12.8* 11.1* 9.1* 10.2* 11.1*  HCT 41.6 34.8* 28.5* 31.6* 33.9*  MCV 102.2* 101.2* 98.6 95.2 93.9  PLT 121* 106* 62* 65* 89*   Cardiac Enzymes:  Recent Labs Lab 05/04/2015 1915 08/16/14 0221 08/16/14 0745  TROPONINI 0.14* 0.15* 0.16*   BNP (last 3 results) No results for input(s): BNP in the last 8760 hours.  ProBNP (last 3 results) No results for input(s): PROBNP in the last 8760 hours.  CBG:  Recent Labs Lab 08/18/14 1116 08/18/14 1548 08/18/14 1924 08/18/14 2314 08/19/14 0326  GLUCAP 149* 186* 196* 162* 152*    Recent Results (from the past 240 hour(s))  Culture, blood (routine x 2)     Status: None (Preliminary result)   Collection Time:  05/04/2015  1:54 PM  Result Value Ref Range Status   Specimen Description BLOOD RIGHT ANTECUBITAL  Final   Special Requests BOTTLES DRAWN AEROBIC AND ANAEROBIC 5ML  Final   Culture   Final           BLOOD  CULTURE RECEIVED NO GROWTH TO DATE CULTURE WILL BE HELD FOR 5 DAYS BEFORE ISSUING A FINAL NEGATIVE REPORT Performed at [facility]    Report Status PENDING  Incomplete  Urine culture     Status: None   Collection Time: 08/18/2014  2:09 PM  Result Value Ref Range Status   Specimen Description URINE, CATHETERIZED  Final   Special Requests NONE  Final   Colony Count NO GROWTH Performed at [facility]   Final   Culture NO GROWTH Performed at [facility]   Final   Report Status 08/16/2014 FINAL  Final  Culture, blood (routine x 2)     Status: None (Preliminary result)   Collection Time: 08/30/2014  3:30 PM  Result Value Ref Range Status   Specimen Description BLOOD RIGHT HAND  Final   Special Requests BOTTLES DRAWN AEROBIC ONLY 6CC  Final   Culture   Final           BLOOD CULTURE RECEIVED NO GROWTH TO DATE CULTURE WILL BE HELD FOR 5 DAYS BEFORE ISSUING A FINAL NEGATIVE REPORT Performed at [facility]    Report Status PENDING  Incomplete  MRSA PCR Screening     Status: None   Collection Time: 09/02/2014  6:43 PM  Result Value Ref Range Status   MRSA by PCR NEGATIVE NEGATIVE Final    Comment:        The GeneXpert MRSA Assay (FDA approved for NASAL specimens only), is one component of a comprehensive MRSA colonization surveillance program. It is not intended to diagnose MRSA infection nor to guide or monitor treatment for MRSA infections.   Urine culture     Status: None   Collection Time: 08/20/2014  8:22 PM  Result Value Ref Range Status   Specimen Description URINE, CATHETERIZED  Final   Special Requests NONE  Final   Colony Count NO GROWTH Performed at [facility]   Final   Culture NO GROWTH Performed at [facility]    Final   Report Status 08/17/2014 FINAL  Final     Studies:  Recent x-ray studies have been reviewed in detail by the Attending Physician  Scheduled Meds:  Scheduled Meds: . antiseptic oral rinse  7 mL Mouth Rinse q12n4p  . chlorhexidine  15 mL Mouth Rinse BID  . enoxaparin (LOVENOX) injection  40 mg Subcutaneous Q24H  . furosemide  20 mg Intravenous Once  . insulin aspart  0-9 Units Subcutaneous Q4H  . levETIRAcetam (KEPPRA) IVPB  750 mg Intravenous Q12H  . levothyroxine  50 mcg Intravenous Daily  . piperacillin-tazobactam (ZOSYN)  IV  3.375 g Intravenous 3 times per day  . valproate sodium  500 mg Intravenous Q12H  . vancomycin  1,000 mg Intravenous Q24H   Continuous Infusions: . dextrose 5 % with kcl 75 mL/hr at 08/19/14 0838  . DOPamine 18 mcg/kg/min (08/19/14 0800)    Time spent on care of this patient: 55 min   Roxanna Mcever, MD 08/19/2014, 9:24 AM  LOS: 4 days   [facility] Office  [phone] Pager - Text Page per [email or web address]  If 7PM-7AM, please contact night-coverage [email or web address]

## 2014-08-19 NOTE — Progress Notes (Signed)
CARE MANAGEMENT NOTE 08/19/2014  Patient:  Eidem,Ysabel   Account Number:  [id number]  Date Initiated:  08/16/2014  Documentation initiated by:  Sumner Boesch  Subjective/Objective Assessment:   severe sepsis     Action/Plan:   [facility]   Anticipated DC Date:  08/22/2014   [facility]  In-house referral  Clinical Social Worker  Hospice / Palliative Care      DC Planning Services  CM consult      [facility]  NA  NA   Choice offered to / List presented to:  NA   DME arranged  NA      DME agency  NA     HH arranged  NA      HH agency  NA   Status of service:  In process, will continue to follow Medicare Important Message given?   (If response is "NO", the following Medicare IM given date fields will be blank) Date Medicare IM given:   Medicare IM given by:   Date Additional Medicare IM given:   Additional Medicare IM given by:    Discharge Disposition:    Per UR Regulation:  Reviewed for med. necessity/level of care/duration of stay  If discussed at Long Length of Stay Meetings, dates discussed:    Comments:  Feb. 12 2016/Jordan Pardini L. Earlene Plateravis, RN, BSN, CCM/Case Management [employer] [phone] No discharge needs present of time of review. family and legal guardian in agreement for patient to now be DNR,remains on BiPAP/iv dopamine   [id number]/Kenitha Glendinning Earlene PlaterDavis, RN, BSN, [place]: [phone]/ Case management. Chart reviewed for discharge planning and present needs. Discharge needs: none present at time of review. attempts to reach the legal guardian for question concerning care and next steps failed.  have called [employer] and left messages on voice on cell and work phone through out day Next chart review due:  [id number]/tct cell of dontay woolard [phone] and [phone] message left that Dr Henrene PastorMcQuiad needs to speak with the legal guardian concern change in condition. Message was with need to urgently contact  [phone]

## 2014-08-19 NOTE — Progress Notes (Signed)
CSW continuing to follow.   CSW spoke with pt legal guardian, Barbarann Ehlers. Per pt legal guardian, she has not spoken with MD today. Per legal guardian, once pt family is able to speak with MD then legal guardian is requesting a statement from the family to present to the director in order for decisions to be made. Per legal guardian, DSS wants to respect pt family wishes as much as possible, but all final decisions have to be made through legal guardian.  CSW met with pt daughter and pt son at bedside. CSW provided support as pt daughter and pt son expressed their feelings surrounding pt current condition. CSW discussed with pt family that DSS has not yet made any further decisions regarding pt care and expressed that they wish to follow pt family decisions as much as possible. CSW discussed that DSS is asking for another statement from pt family regarding pt care in order to assist DSS in making further decision. Pt family expressed that they are awaiting to speak with MD in order to discuss further how they would wish to proceed. RN was able to reach attending MD via telephone and pt family was able to speak with attending MD.   CSW followed up with pt family following their discussion with attending MD and per pt family, attending MD recommended seeing how pt does overnight and if pt does not make improvements then discussing transitioning to comfort measures tomorrow. Pt family stated that they understand that Dr. Hilma Favors from palliative care was wishing to speak with them as well and they are open to meeting with Dr. Hilma Favors tomorrow. Pt family expressed that they could be to hospital by 10 am, but for Dr. Hilma Favors to contact pt son, Quita Skye to arrange time to meet. CSW notified Dr. Hilma Favors and provided pt son, [name]'s phone number to Dr. Hilma Favors.  CSW contacted pt legal guardian, Barbarann Ehlers and notified her that pt family will not be able to make a decision before 5 pm today. Pt legal guardian stated  that during the weekend, contact the on-call social worker at [phone] and ask for Social Worker on call and explain situation of Mr. Wence and social worker on call should be able to reach Government social research officer in order to get decision for pt regarding de-escalating pt care. Per legal guardian, Barbarann Ehlers, pt family would also need to write an updated statement regarding their wishes and statement to be witnessed and faxed to [phone]. CSW notified pt family that pt guardian requested updated statement. Per legal guardian, there is no way for her to leave notification to the on call social worker regarding pt.   Weekend Education officer, museum should be contacted for any needs surrounding pt.   CSW to continue to follow.  Alison Murray, MSW, [place] [facility] [phone]

## 2014-08-20 ENCOUNTER — Inpatient Hospital Stay (HOSPITAL_COMMUNITY): Payer: Medicare Other

## 2014-08-20 DIAGNOSIS — N179 Acute kidney failure, unspecified: Secondary | ICD-10-CM

## 2014-08-20 DIAGNOSIS — N189 Chronic kidney disease, unspecified: Secondary | ICD-10-CM

## 2014-08-20 LAB — BLOOD GAS, ARTERIAL
Acid-base deficit: 8.3 mmol/L — ABNORMAL HIGH (ref 0.0–2.0)
Bicarbonate: 16.6 mEq/L — ABNORMAL LOW (ref 20.0–24.0)
Drawn by: 257701
FIO2: 1 %
O2 Saturation: 91.5 %
PCO2 ART: 33.4 mmHg — AB (ref 35.0–45.0)
PH ART: 7.316 — AB (ref 7.350–7.450)
PO2 ART: 61 mmHg — AB (ref 80.0–100.0)
Patient temperature: 37
TCO2: 15.6 mmol/L (ref 0–100)

## 2014-08-20 LAB — GLUCOSE, CAPILLARY
GLUCOSE-CAPILLARY: 105 mg/dL — AB (ref 70–99)
GLUCOSE-CAPILLARY: 90 mg/dL (ref 70–99)
Glucose-Capillary: 127 mg/dL — ABNORMAL HIGH (ref 70–99)
Glucose-Capillary: 149 mg/dL — ABNORMAL HIGH (ref 70–99)
Glucose-Capillary: 106 mg/dL — ABNORMAL HIGH (ref 70–99)

## 2014-08-20 LAB — BASIC METABOLIC PANEL
Anion gap: 9 (ref 5–15)
BUN: 30 mg/dL — AB (ref 6–23)
CALCIUM: 7.8 mg/dL — AB (ref 8.4–10.5)
CO2: 17 mmol/L — ABNORMAL LOW (ref 19–32)
Chloride: 124 mmol/L — ABNORMAL HIGH (ref 96–112)
Creatinine, Ser: 1.44 mg/dL — ABNORMAL HIGH (ref 0.50–1.35)
GFR, EST AFRICAN AMERICAN: 52 mL/min — AB (ref >90)
GFR, EST NON AFRICAN AMERICAN: 45 mL/min — AB (ref >90)
GLUCOSE: 193 mg/dL — AB (ref 70–99)
POTASSIUM: 3.8 mmol/L (ref 3.5–5.1)
SODIUM: 150 mmol/L — AB (ref 135–145)

## 2014-08-20 LAB — CBC
HEMATOCRIT: 31.6 % — AB (ref 39.0–52.0)
Hemoglobin: 10.3 g/dL — ABNORMAL LOW (ref 13.0–17.0)
MCH: 30.9 pg (ref 26.0–34.0)
MCHC: 32.6 g/dL (ref 30.0–36.0)
MCV: 94.9 fL (ref 78.0–100.0)
PLATELETS: 45 10*3/uL — AB (ref 150–400)
RBC: 3.33 MIL/uL — ABNORMAL LOW (ref 4.22–5.81)
RDW: 16.6 % — ABNORMAL HIGH (ref 11.5–15.5)
WBC: 9.9 10*3/uL (ref 4.0–10.5)

## 2014-08-20 MED ORDER — MORPHINE SULFATE 2 MG/ML IJ SOLN
2.0000 mg | Freq: Once | INTRAMUSCULAR | Status: AC
  Administered 2014-08-20: 2 mg via INTRAVENOUS
  Filled 2014-08-20: qty 1

## 2014-08-20 MED ORDER — DEXTROSE 50 % IV SOLN
50.0000 mL | Freq: Once | INTRAVENOUS | Status: AC
  Administered 2014-08-20: 50 mL via INTRAVENOUS

## 2014-08-20 MED ORDER — DEXTROSE 50 % IV SOLN
INTRAVENOUS | Status: AC
  Filled 2014-08-20: qty 50

## 2014-08-20 MED ORDER — FUROSEMIDE 10 MG/ML IJ SOLN
20.0000 mg | Freq: Once | INTRAMUSCULAR | Status: AC
  Administered 2014-08-20: 20 mg via INTRAVENOUS
  Filled 2014-08-20: qty 2

## 2014-08-20 MED ORDER — MORPHINE SULFATE 2 MG/ML IJ SOLN
2.0000 mg | INTRAMUSCULAR | Status: DC | PRN
Start: 2014-08-20 — End: 2014-08-21
  Administered 2014-08-20: 2 mg via INTRAVENOUS
  Filled 2014-08-20: qty 1

## 2014-08-20 NOTE — Progress Notes (Signed)
PT appear to be sleeping at this time. RT completed NTS times 2 with small, white, thin mucus- PT tolerated well- Spo2 93% at this time, HR 97, RR 30, BS RH in upper airway.

## 2014-08-20 NOTE — Progress Notes (Signed)
Palliative Medicine Team Progress Note  Spoke at length with patient's son [name] by phone. He and his sister will not be able to make it to the hospital this morning. They are close to deciding that comfort care is probably the best plan at this point but after talking to Dr. Butler Denmarkizwan yesterday they want to "give it more time"-now on HiOD #5, temp last PM, high O2 requirements, BiPap, significant effusions. We discussed the big picture and even if his dad were able to pull through what would be restoring him to and at what functional status. [name] is very clear that he would not want to have his dad go through feeding tube again or being in a nearly completely dependent state again-such as after his dad's subdural hematoma a few years ago. Mr. Ricardo [name] has a very low chance of recovery even back to his previous state of health which at best was almost completely dependent with advanced dementia.  [name] wants his sister [name] to help with "decisions". He is going to call me back with a time to meet either later today or tomorrow AM- maintain status quo for now.   Anderson MaltaElizabeth Golding, DO Palliative Medicine

## 2014-08-20 NOTE — Progress Notes (Signed)
[facility] Progress Note   Ricardo [name] UJW:[id number] DOB: [birth year] DOA: 08/11/2014 PCP: Oneal Grout, MD  Brief narrative: Ricardo [name] is a 80 y.o. male from SNF who is a ward of the state admitted with lethargy and found to have sepsis, hypoxia, UTI, pneumonia and severe hypernatremia. His children have spoken with me and state that when they went to see him 3 days ago, he had a cough, appears sick and they requested he be admitted to the hospital on that day.    Subjective: Patient remains unresponsive.   Assessment/Plan: Principal Problem:   Septic shock - given numerous fluid boluses without improvement in BP- Dopamine started- BP improved for now (A) UTI (B) HCAP Cont Vanc and Zosyn for coverage of above- Cultures NGTD Cont Dopamine infusion to keep SBP > 90- currently requiring 10 mcg of Dopamine  Active Problems:   Acute respiratory failure with hypoxia - due to HCAP and now due to pulm edema -do not intubate- Dr Kendrick Fries discussed this with family- we have signed appropriate paperwork to make him a DNR - his legal guardian has been notified by Dr Kendrick Fries  - cont Lasix and stop IVF today as pulm edema worse-   ARF- CKD 2 - may be due to fluid overload- cont Lasix  Hypokalemia - being replaced via IV- cont to follow now that we will be giving lasix  DM 2  - new diagonsis - A1c 6.8- cont sliding scale    Hypothyroidism - cont synthroid IV  Seizure disorder - cont IV Keppra    Hypertensive heart and renal disease - currently hypotensive- follow BP carefully with A-line    Dementia with psychosis - unable to resume home meds due to lethargy  Code Status: DNR Family Communication: daughter and son Disposition Plan: critically ill DVT prophylaxis: Lovenox  Consultants: PCCM  Procedures:   Antibiotics: Anti-infectives    Start     Dose/Rate Route Frequency Ordered Stop   08/17/14 1800  vancomycin (VANCOCIN) IVPB 1000 mg/200 mL premix     1,000 mg 200 mL/hr over 60 Minutes Intravenous Every 24 hours 08/17/14 1002     08/20/2014 2200  piperacillin-tazobactam (ZOSYN) IVPB 3.375 g     3.375 g 12.5 mL/hr over 240 Minutes Intravenous 3 times per day 08/25/2014 1832     08/11/2014 2000  vancomycin (VANCOCIN) IVPB 750 mg/150 ml premix  Status:  Discontinued     750 mg 150 mL/hr over 60 Minutes Intravenous Every 24 hours 08/19/2014 1832 08/17/14 1002   09/04/2014 1530  levofloxacin (LEVAQUIN) IVPB 750 mg     750 mg 100 mL/hr over 90 Minutes Intravenous  Once 09/02/2014 1522 08/16/2014 1720   08/10/2014 1530  aztreonam (AZACTAM) 2 g in dextrose 5 % 50 mL IVPB     2 g 100 mL/hr over 30 Minutes Intravenous  Once 08/30/2014 1522 08/12/2014 1808         Objective: Filed Weights   08/27/2014 1820 08/16/14 0500 08/17/14 0500  Weight: 57.6 kg (126 lb 15.8 oz) 58.9 kg (129 lb 13.6 oz) 62.9 kg (138 lb 10.7 oz)    Intake/Output Summary (Last 24 hours) at 08/20/14 0925 Last data filed at 08/20/14 0634  Gross per 24 hour  Intake 3580.88 ml  Output   1685 ml  Net 1895.88 ml     Vitals Filed Vitals:   08/20/14 0313 08/20/14 0346 08/20/14 0439 08/20/14 0639  BP:   88/59 117/47  Pulse:  102  98  Temp: 98 F (  36.7 C)     TempSrc: Axillary     Resp:  41  41  Height:      Weight:      SpO2:  93%  94%    Exam: General: alert , resp distress  Lungs: coarse breath sounds, tahcypenic on BiPAP- RR in 40s today Cardiovascular: Regular rate and rhythm without murmur gallop or rub normal S1 and S2- no longer tahcycardic Abdomen: Nontender, nondistended, soft, bowel sounds positive, no rebound, no ascites, no appreciable mass Extremities: No significant cyanosis, clubbing, or edema bilateral lower extremities  Data Reviewed: Basic Metabolic Panel:  Recent Labs Lab 2014/08/29 1915  08/17/14 0309 08/17/14 1200 08/18/14 0349 08/19/14 0430 08/20/14 0615  NA 170*  < > 165* 160* 156* 151* 150*  K 3.2*  < > 2.9* 3.5 3.6 3.4* 3.8  CL >130*  < > >130*  >130* >130* 129* 124*  CO2 20  < > 17* 14* 14* 16* 17*  GLUCOSE 213*  < > 273* 292* 267* 165* 193*  BUN 67*  < > 43* 43* 37* 31* 30*  CREATININE 1.49*  < > 1.63* 1.48* 1.25 1.38* 1.44*  CALCIUM 7.9*  < > 7.2* 7.5* 7.8* 7.9* 7.8*  MG 2.1  --  1.7  --   --   --   --   PHOS 2.2*  --   --   --   --   --   --   < > = values in this interval not displayed. Liver Function Tests:  Recent Labs Lab August 29, 2014 1353 08/16/14 0221 08/17/14 0309  AST 86* 82* 45*  ALT 68* 54* 35  ALKPHOS 212* 155* 110  BILITOT 2.5* 2.5* 1.4*  PROT 7.3 5.7* 4.8*  ALBUMIN 3.3* 2.4* 1.9*   No results for input(s): LIPASE, AMYLASE in the last 168 hours. No results for input(s): AMMONIA in the last 168 hours. CBC:  Recent Labs Lab 08/29/2014 1353 08/16/14 0221 08/17/14 0309 08/18/14 0349 08/19/14 0430 08/20/14 0615  WBC 10.5 8.0 5.8 9.4 17.5* 9.9  NEUTROABS 7.7  --   --   --   --   --   HGB 12.8* 11.1* 9.1* 10.2* 11.1* 10.3*  HCT 41.6 34.8* 28.5* 31.6* 33.9* 31.6*  MCV 102.2* 101.2* 98.6 95.2 93.9 94.9  PLT 121* 106* 62* 65* 89* 45*   Cardiac Enzymes:  Recent Labs Lab 29-Aug-2014 1915 08/16/14 0221 08/16/14 0745  TROPONINI 0.14* 0.15* 0.16*   BNP (last 3 results) No results for input(s): BNP in the last 8760 hours.  ProBNP (last 3 results) No results for input(s): PROBNP in the last 8760 hours.  CBG:  Recent Labs Lab 08/19/14 0836 08/19/14 1206 08/19/14 1715 08/19/14 2118 08/20/14 0514  GLUCAP 139* 155* 147* 157* 105*    Recent Results (from the past 240 hour(s))  Culture, blood (routine x 2)     Status: None (Preliminary result)   Collection Time: August 29, 2014  1:54 PM  Result Value Ref Range Status   Specimen Description BLOOD RIGHT ANTECUBITAL  Final   Special Requests BOTTLES DRAWN AEROBIC AND ANAEROBIC  Final   Culture   Final           BLOOD CULTURE RECEIVED NO GROWTH TO DATE CULTURE WILL BE HELD FOR 5 DAYS BEFORE ISSUING A FINAL NEGATIVE REPORT Performed at [facility]    Report Status PENDING  Incomplete  Urine culture     Status: None   Collection Time: 08/29/2014  2:09 PM  Result Value Ref Range Status   Specimen Description URINE, CATHETERIZED  Final   Special Requests NONE  Final   Colony Count NO GROWTH [facility] at [facility]   Final   Culture NO GROWTH [facility] at [facility]   Final   Report Status 08/16/2014 FINAL  Final  Culture, blood (routine x 2)     Status: None (Preliminary result)   Collection Time: 08/19/2014  3:30 PM  Result Value Ref Range Status   Specimen Description BLOOD RIGHT HAND  Final   Special Requests BOTTLES DRAWN AEROBIC ONLY 6CC  Final   Culture   Final           BLOOD CULTURE RECEIVED NO GROWTH TO DATE CULTURE WILL BE HELD FOR 5 DAYS BEFORE ISSUING A FINAL NEGATIVE REPORT Performed at [facility]    Report Status PENDING  Incomplete  MRSA PCR Screening     Status: None   Collection Time: 09/03/2014  6:43 PM  Result Value Ref Range Status   MRSA by PCR NEGATIVE NEGATIVE Final    Comment:        The GeneXpert MRSA Assay (FDA approved for NASAL specimens only), is one component of a comprehensive MRSA colonization surveillance program. It is not intended to diagnose MRSA infection nor to guide or monitor treatment for MRSA infections.   Urine culture     Status: None   Collection Time: 09/03/2014  8:22 PM  Result Value Ref Range Status   Specimen Description URINE, CATHETERIZED  Final   Special Requests NONE  Final   Colony Count NO GROWTH [facility]formed at [facility]   Final   Culture NO GROWTH [facility]formed at [facility]   Final   Report Status 08/17/2014 FINAL  Final     Studies:  Recent x-ray studies have been reviewed in detail by the Attending Physician  Scheduled Meds:  Scheduled Meds: . antiseptic oral rinse  7 mL Mouth Rinse q12n4p  . chlorhexidine  15 mL Mouth Rinse BID  . enoxaparin (LOVENOX) injection  40 mg Subcutaneous Q24H  .  furosemide  20 mg Intravenous Once  . insulin aspart  0-9 Units Subcutaneous Q4H  . levETIRAcetam (KEPPRA) IVPB  750 mg Intravenous Q12H  . levothyroxine  50 mcg Intravenous Daily  . piperacillin-tazobactam (ZOSYN)  IV  3.375 g Intravenous 3 times per day  . valproate sodium  500 mg Intravenous Q12H  . vancomycin  1,000 mg Intravenous Q24H   Continuous Infusions: . DOPamine 20 mcg/kg/min (08/20/14 [id number])    Time spent on care of this patient: 35 min   Olive Motyka, MD 08/20/2014, 9:25 AM  LOS: 5 days   [facility] Office  [phone] Pager - Text Page per [email or web address]  If 7PM-7AM, please contact night-coverage [email or web address]

## 2014-08-20 NOTE — Progress Notes (Signed)
Pt placed on V60 Bipap per MD order.

## 2014-08-20 NOTE — Progress Notes (Signed)
Was notified by Freddi CheAdam Spink (son) for a status update on his father. States he will be back in town around 11 pm tonight to check on him.

## 2014-08-21 ENCOUNTER — Inpatient Hospital Stay (HOSPITAL_COMMUNITY): Payer: Medicare Other

## 2014-08-21 LAB — BLOOD GAS, ARTERIAL
ACID-BASE DEFICIT: 7.1 mmol/L — AB (ref 0.0–2.0)
Bicarbonate: 16.8 mEq/L — ABNORMAL LOW (ref 20.0–24.0)
Delivery systems: POSITIVE
Drawn by: 257701
Expiratory PAP: 8
FIO2: 0.9 %
Inspiratory PAP: 12
Mode: POSITIVE
O2 SAT: 96.7 %
PCO2 ART: 30.4 mmHg — AB (ref 35.0–45.0)
Patient temperature: 37
RATE: 8 resp/min
TCO2: 15.3 mmol/L (ref 0–100)
pH, Arterial: 7.362 (ref 7.350–7.450)
pO2, Arterial: 81.4 mmHg (ref 80.0–100.0)

## 2014-08-21 LAB — CULTURE, BLOOD (ROUTINE X 2)
CULTURE: NO GROWTH
CULTURE: NO GROWTH

## 2014-08-21 LAB — GLUCOSE, CAPILLARY
GLUCOSE-CAPILLARY: 60 mg/dL — AB (ref 70–99)
GLUCOSE-CAPILLARY: 103 mg/dL — AB (ref 70–99)
Glucose-Capillary: 114 mg/dL — ABNORMAL HIGH (ref 70–99)
Glucose-Capillary: 64 mg/dL — ABNORMAL LOW (ref 70–99)
Glucose-Capillary: 160 mg/dL — ABNORMAL HIGH (ref 70–99)
Glucose-Capillary: 111 mg/dL — ABNORMAL HIGH (ref 70–99)

## 2014-08-21 LAB — BASIC METABOLIC PANEL
Anion gap: 3 — ABNORMAL LOW (ref 5–15)
BUN: 40 mg/dL — ABNORMAL HIGH (ref 6–23)
CHLORIDE: 124 mmol/L — AB (ref 96–112)
CO2: 18 mmol/L — ABNORMAL LOW (ref 19–32)
CREATININE: 1.59 mg/dL — AB (ref 0.50–1.35)
Calcium: 7.2 mg/dL — ABNORMAL LOW (ref 8.4–10.5)
GFR calc Af Amer: 46 mL/min — ABNORMAL LOW (ref >90)
GFR calc non Af Amer: 40 mL/min — ABNORMAL LOW (ref >90)
GLUCOSE: 205 mg/dL — AB (ref 70–99)
Potassium: 3.5 mmol/L (ref 3.5–5.1)
Sodium: 145 mmol/L (ref 135–145)

## 2014-08-21 LAB — CBC
HCT: 30 % — ABNORMAL LOW (ref 39.0–52.0)
HEMOGLOBIN: 9.8 g/dL — AB (ref 13.0–17.0)
MCH: 31.1 pg (ref 26.0–34.0)
MCHC: 32.7 g/dL (ref 30.0–36.0)
MCV: 95.2 fL (ref 78.0–100.0)
RBC: 3.15 MIL/uL — ABNORMAL LOW (ref 4.22–5.81)
RDW: 17 % — ABNORMAL HIGH (ref 11.5–15.5)
WBC: 13.7 10*3/uL — ABNORMAL HIGH (ref 4.0–10.5)

## 2014-08-21 MED ORDER — HYDROMORPHONE BOLUS VIA INFUSION
1.0000 mg | INTRAVENOUS | Status: DC | PRN
Start: 2014-08-21 — End: 2014-08-21
  Filled 2014-08-21: qty 1

## 2014-08-21 MED ORDER — SCOPOLAMINE 1 MG/3DAYS TD PT72
1.0000 | MEDICATED_PATCH | TRANSDERMAL | Status: DC
  Administered 2014-08-21: 1.5 mg via TRANSDERMAL
  Filled 2014-08-21: qty 1

## 2014-08-21 MED ORDER — SODIUM CHLORIDE 0.9 % IV SOLN
0.5000 mg/h | INTRAVENOUS | Status: DC
  Administered 2014-08-21: 0.5 mg/h via INTRAVENOUS
  Filled 2014-08-21: qty 2.5

## 2014-08-21 MED ORDER — DEXTROSE 50 % IV SOLN
INTRAVENOUS | Status: AC
  Filled 2014-08-21: qty 50

## 2014-08-21 MED ORDER — HYDROMORPHONE HCL 1 MG/ML IJ SOLN
1.0000 mg | Freq: Once | INTRAMUSCULAR | Status: AC
  Administered 2014-08-21: 1 mg via INTRAVENOUS

## 2014-08-21 MED ORDER — DEXTROSE 50 % IV SOLN
50.0000 mL | Freq: Once | INTRAVENOUS | Status: AC
  Administered 2014-08-21: 50 mL via INTRAVENOUS

## 2014-08-21 MED ORDER — LORAZEPAM 2 MG/ML IJ SOLN
1.0000 mg | Freq: Two times a day (BID) | INTRAMUSCULAR | Status: DC
  Administered 2014-08-21: 1 mg via INTRAVENOUS
  Filled 2014-08-21: qty 1

## 2014-08-21 MED ORDER — FUROSEMIDE 10 MG/ML IJ SOLN
40.0000 mg | Freq: Once | INTRAMUSCULAR | Status: AC
  Administered 2014-08-21: 40 mg via INTRAVENOUS
  Filled 2014-08-21: qty 4

## 2014-08-21 MED ORDER — LORAZEPAM 2 MG/ML IJ SOLN: 1.0000 mg | INTRAMUSCULAR | Status: DC | PRN

## 2014-08-22 NOTE — Discharge Summary (Addendum)
Death Summary  Ricardo [name] ZOX:[id number] DOB: 1935/02/22 DOA: 09/03/2014  PCP: [name], [name], MD  Admit date: 08/08/2014 Date of Death: 08/22/2014  Final Diagnoses:  Principal Problem:   Septic shock Active Problems:   HCAP (healthcare-associated pneumonia)   Acute respiratory failure with hypoxia   UTI (lower urinary tract infection)   Renal failure (ARF), acute on chronic   Hypothyroidism   Hypertensive heart and renal disease   Chronic kidney disease, stage II (mild)   Dementia with psychosis   DNR (do not resuscitate) discussion   Palliative care encounter   Dyspnea    Patient died at 1700 on 01/30/15.   Please see last progress note below:  Brief narrative: Ricardo BrickGeorge Setterlund is a 80 y.o. male from SNF who is a ward of the state admitted with lethargy and found to have sepsis, hypoxia, UTI, pneumonia and severe hypernatremia. His children have spoken with me and state that when they went to see him 3 days ago, he had a cough, appears sick and they requested he be admitted to the hospital on that day.    Subjective: Patient remains unresponsive. I have spoken in detail with the family who are now wanting to consider comfort care.   Assessment/Plan: Principal Problem:  Septic shock - given numerous fluid boluses without improvement in BP- Dopamine started for septic shock (A) UTI (B) HCAP On Vanc and Zosyn for coverage of above- Cultures NGTD On Dopamine infusion to keep SBP > 90- currently requiring 20 mcg of Dopamine - patient continues to be poorly responsive,  - I have reached the patient's on call legal guardian (this is a weekend) to transition him to comfort care- I have spoken with Mertie Mooresheryl Millmore, who is the Dietitianprogram manager of Adult services, in regards to the family's request for comfort care. The family has written up a statement in regards to their wishes for stopping are treatments and transitioning Mr Virl AxeMatton to comfort care and the document has been faxed to  [phone] as requested.  - I have spoken with the family in detail and will now stop the Dopaimine, BiPAP and all medications except for Morphine PRN for pain/respiratory discomfort/ air hunger. We will continue O 2 for comfort. The patient's family (son, daughter and daughter in law) are in agreement with this plan.   Active Problems:  Acute respiratory failure with hypoxia - due to HCAP and now due to pulm edema - see above  ARF- CKD 2  Hypokalemia  DM 2  - new diagonsis - A1c 6.8   Hypothyroidism  Seizure disorder   Hypertensive heart and renal disease - currently hypotensive and on Dopamine   Dementia with psychosis - unable to resume home meds due to lethargy   Time: 15 min  Signed:  Tinamarie Przybylski  [facility] 08/22/2014, 1:16 PM

## 2014-09-06 NOTE — Clinical Social Work Note (Signed)
CSW received a call from Md who stated that both legal guardian and son came to a decision about pt care going  Palliative.  MD stated that she would have secretary fax information to DSS  .Ricardo Bubaegina Hezzie Karim, LCSW [facility] Clinical Social Worker - Weekend Coverage cell #: [phone]

## 2014-09-06 NOTE — Progress Notes (Signed)
Comments:    Spoke with family at their request at bedside regarding their wish to transition pt to comfort care. They seem to be aware and accepting of the fact that though pt (their father) is not likely to survive this illness, if he did his quality of life would be minimal as it was already limited prior to admission. They wished to discuss the process of transitioning to comfort care with regard to his legal guardian, etc. I explained that (per note in chart) they would have to reformulate their letter expressing their wish to transition the pt to comfort care and have it witnessed by 2 witnesses. Dr Phillips OdorGolding can then contact the the on call SW who would then contact the legal guardian. They also had questions regarding the process of withdrawing care and exactly "how would it happen" and "how long would he live"? After our discussion family decided to go home and rest as they all have not slept much in the last 24 hours. They will return in am in hopes of discussing this further with Dr Phillips OdorGolding. They do plan to rewrite the letter and bring it with them in the morning when they return.   Leanne ChangKatherine P. Cosby Proby, NP-C [facility] Pager [phone]

## 2014-09-06 NOTE — Progress Notes (Signed)
Patient passed away at 1700 today due to respiratory and cardiac failure.  He was pronounced by Gennette PacStephanie Russel, RN and Lissa MerlinMichael Morgan, RN.

## 2014-09-06 NOTE — Progress Notes (Signed)
[facility] Progress Note   Ricardo [name] ZOX:[id number] DOB: [birth year] DOA: 08/19/2014 PCP: Oneal Grout, MD  Brief narrative: Ricardo [name] is a 80 y.o. male from SNF who is a ward of the state admitted with lethargy and found to have sepsis, hypoxia, UTI, pneumonia and severe hypernatremia. His children have spoken with me and state that when they went to see him 3 days ago, he had a cough, appears sick and they requested he be admitted to the hospital on that day.    Subjective: Patient remains unresponsive. I have spoken in detail with the family who are now wanting to consider comfort care.   Assessment/Plan: Principal Problem:   Septic shock - given numerous fluid boluses without improvement in BP- Dopamine started for septic shock (A) UTI (B) HCAP Cont Vanc and Zosyn for coverage of above- Cultures NGTD Cont Dopamine infusion to keep SBP > 90- currently requiring 20 mcg of Dopamine - patient continues to be poorly responsive,  - I have reached the patient's on call legal guardian (this is a weekend) to transition him to comfort care- I have spoken with Mertie Moores, who is the Dietitian of Adult services, in regards to the family's request for comfort care. The family has written up a statement in regards to their wishes for stopping are treatments and transitioning Ricardo [name] to comfort care and the document has been faxed to [phone] as requested.  - I have spoken with the family in detail and will now stop the Dopaimine, BiPAP and all medications except for Morphine PRN for pain/respiratory discomfort/ air hunger. We will continue O 2 for comfort. The patient's family (son, daughter and daughter in law) are in agreement with this plan.   Active Problems:   Acute respiratory failure with hypoxia - due to HCAP and now due to pulm edema - see above  ARF- CKD 2  Hypokalemia  DM 2  - new diagonsis - A1c 6.8    Hypothyroidism  Seizure disorder     Hypertensive heart and renal disease - currently hypotensive and on Dopamine    Dementia with psychosis - unable to resume home meds due to lethargy  Code Status: DNR Family Communication: daughter and son Disposition Plan: critically ill DVT prophylaxis: Lovenox  Consultants: PCCM  Procedures:   Antibiotics: Anti-infectives    Start     Dose/Rate Route Frequency Ordered Stop   08/17/14 1800  vancomycin (VANCOCIN) IVPB 1000 mg/200 mL premix     1,000 mg 200 mL/hr over 60 Minutes Intravenous Every 24 hours 08/17/14 1002     08/20/2014 2200  piperacillin-tazobactam (ZOSYN) IVPB 3.375 g     3.375 g 12.5 mL/hr over 240 Minutes Intravenous 3 times per day 08/11/2014 1832     08/18/2014 2000  vancomycin (VANCOCIN) IVPB 750 mg/150 ml premix  Status:  Discontinued     750 mg 150 mL/hr over 60 Minutes Intravenous Every 24 hours 08/13/2014 1832 08/17/14 1002   09/04/2014 1530  levofloxacin (LEVAQUIN) IVPB 750 mg     750 mg 100 mL/hr over 90 Minutes Intravenous  Once 08/28/2014 1522 08/16/2014 1720   08/30/2014 1530  aztreonam (AZACTAM) 2 g in dextrose 5 % 50 mL IVPB     2 g 100 mL/hr over 30 Minutes Intravenous  Once 08/25/2014 1522 08/14/2014 1808         Objective: Filed Weights   08/16/14 0500 08/17/14 0500 2014-08-24 0437  Weight: 58.9 kg (129 lb 13.6 oz) 62.9 kg (138 lb 10.7  oz) 66.3 kg (146 lb 2.6 oz)    Intake/Output Summary (Last 24 hours) at Jan 21, 2015 1120 Last data filed at Jan 21, 2015 1100  Gross per 24 hour  Intake 1996.16 ml  Output   1025 ml  Net 971.16 ml     Vitals Filed Vitals:   Jan 21, 2015 0900 Jan 21, 2015 1000 Jan 21, 2015 1100 Jan 21, 2015 1108  BP:    109/43  Pulse: 90 94 96 97  Temp:      TempSrc:      Resp: 33 35 37 38  Height:      Weight:      SpO2: 100% 100% 99% 97%    Exam: General: lethargic Lungs: coarse breath sounds, tahcypenic on BiPAP- Cardiovascular: Regular rate and rhythm without murmur gallop or rub normal S1 and S2- no longer tahcycardic Abdomen:  Nontender, nondistended, soft, bowel sounds positive, no rebound, no ascites, no appreciable mass Extremities: No significant cyanosis, clubbing, or edema bilateral lower extremities- cyanosis of tips of fingers noted  Data Reviewed: Basic Metabolic Panel:  Recent Labs Lab 08/18/2014 1915  08/17/14 0309 08/17/14 1200 08/18/14 0349 08/19/14 0430 08/20/14 0615 Jan 21, 2015 0400  NA 170*  < > 165* 160* 156* 151* 150* 145  K 3.2*  < > 2.9* 3.5 3.6 3.4* 3.8 3.5  CL >130*  < > >130* >130* >130* 129* 124* 124*  CO2 20  < > 17* 14* 14* 16* 17* 18*  GLUCOSE 213*  < > 273* 292* 267* 165* 193* 205*  BUN 67*  < > 43* 43* 37* 31* 30* 40*  CREATININE 1.49*  < > 1.63* 1.48* 1.25 1.38* 1.44* 1.59*  CALCIUM 7.9*  < > 7.2* 7.5* 7.8* 7.9* 7.8* 7.2*  MG 2.1  --  1.7  --   --   --   --   --   PHOS 2.2*  --   --   --   --   --   --   --   < > = values in this interval not displayed. Liver Function Tests:  Recent Labs Lab 08/12/2014 1353 08/16/14 0221 08/17/14 0309  AST 86* 82* 45*  ALT 68* 54* 35  ALKPHOS 212* 155* 110  BILITOT 2.5* 2.5* 1.4*  PROT 7.3 5.7* 4.8*  ALBUMIN 3.3* 2.4* 1.9*   No results for input(s): LIPASE, AMYLASE in the last 168 hours. No results for input(s): AMMONIA in the last 168 hours. CBC:  Recent Labs Lab 08/10/2014 1353  08/17/14 0309 08/18/14 0349 08/19/14 0430 08/20/14 0615 Jan 21, 2015 0400  WBC 10.5  < > 5.8 9.4 17.5* 9.9 13.7*  NEUTROABS 7.7  --   --   --   --   --   --   HGB 12.8*  < > 9.1* 10.2* 11.1* 10.3* 9.8*  HCT 41.6  < > 28.5* 31.6* 33.9* 31.6* 30.0*  MCV 102.2*  < > 98.6 95.2 93.9 94.9 95.2  PLT 121*  < > 62* 65* 89* 45* CONSISTENT WITH PREVIOUS RESULT  < > = values in this interval not displayed. Cardiac Enzymes:  Recent Labs Lab 08/11/2014 1915 08/16/14 0221 08/16/14 0745  TROPONINI 0.14* 0.15* 0.16*   BNP (last 3 results) No results for input(s): BNP in the last 8760 hours.  ProBNP (last 3 results) No results for input(s): PROBNP in the last  8760 hours.  CBG:  Recent Labs Lab 08/20/14 1643 08/20/14 2143 08/20/14 2238 Jan 21, 2015 0542 Jan 21, 2015 0818  GLUCAP 106* 60* 90 160* 111*    Recent Results (from the past 240  hour(s))  Culture, blood (routine x 2)     Status: None (Preliminary result)   Collection Time: 2014/08/29  1:54 PM  Result Value Ref Range Status   Specimen Description BLOOD RIGHT ANTECUBITAL  Final   Special Requests BOTTLES DRAWN AEROBIC AND ANAEROBIC  Final   Culture   Final           BLOOD CULTURE RECEIVED NO GROWTH TO DATE CULTURE WILL BE HELD FOR 5 DAYS BEFORE ISSUING A FINAL NEGATIVE REPORT Performed at [facility]    Report Status PENDING  Incomplete  Urine culture     Status: None   Collection Time: 2014/08/29  2:09 PM  Result Value Ref Range Status   Specimen Description URINE, CATHETERIZED  Final   Special Requests NONE  Final   Colony Count NO GROWTH Performed at [facility]   Final   Culture NO GROWTH Performed at [facility]   Final   Report Status 08/16/2014 FINAL  Final  Culture, blood (routine x 2)     Status: None (Preliminary result)   Collection Time: 29-Aug-2014  3:30 PM  Result Value Ref Range Status   Specimen Description BLOOD RIGHT HAND  Final   Special Requests BOTTLES DRAWN AEROBIC ONLY 6CC  Final   Culture   Final           BLOOD CULTURE RECEIVED NO GROWTH TO DATE CULTURE WILL BE HELD FOR 5 DAYS BEFORE ISSUING A FINAL NEGATIVE REPORT Performed at [facility]    Report Status PENDING  Incomplete  MRSA PCR Screening     Status: None   Collection Time: August 29, 2014  6:43 PM  Result Value Ref Range Status   MRSA by PCR NEGATIVE NEGATIVE Final    Comment:        The GeneXpert MRSA Assay (FDA approved for NASAL specimens only), is one component of a comprehensive MRSA colonization surveillance program. It is not intended to diagnose MRSA infection nor to guide or monitor treatment for MRSA infections.   Urine culture     Status: None    Collection Time: 2014-08-29  8:22 PM  Result Value Ref Range Status   Specimen Description URINE, CATHETERIZED  Final   Special Requests NONE  Final   Colony Count NO GROWTH Performed at [facility]   Final   Culture NO GROWTH Performed at [facility]   Final   Report Status 08/17/2014 FINAL  Final     Studies:  Recent x-ray studies have been reviewed in detail by the Attending Physician  Scheduled Meds:  Scheduled Meds: . antiseptic oral rinse  7 mL Mouth Rinse q12n4p  . chlorhexidine  15 mL Mouth Rinse BID  . insulin aspart  0-9 Units Subcutaneous Q4H  . levETIRAcetam (KEPPRA) IVPB  750 mg Intravenous Q12H  . levothyroxine  50 mcg Intravenous Daily  . piperacillin-tazobactam (ZOSYN)  IV  3.375 g Intravenous 3 times per day  . valproate sodium  500 mg Intravenous Q12H  . vancomycin  1,000 mg Intravenous Q24H   Continuous Infusions: . DOPamine 20 mcg/kg/min (08/08/2014 0429)    Time spent on care of this patient: 55 min    Kuyper, MD 08/20/2014, 11:20 AM  LOS: 6 days   [facility] Office  [phone] Pager - Text Page per [email or web address]  If 7PM-7AM, please contact night-coverage [email or web address]

## 2014-09-06 NOTE — Progress Notes (Signed)
Palliative Medicine Team Progress Note  1. Sepsis, Recurrent PNA 2. Advanced Vascular Dementia 3. Seizure Disorder 4. 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  Met with patient's family including daughter Stanton Kidney, son Quita Skye and Mr. Victorian SIL, DIL and a close friend. Family gathered together today requesting full comfort care and to no longer continue aggressive medical interventions to sustain their father in his current condition. They feel on some levels that he has "already died". They have appropriate grief. I let them talk openly and reason analytically over the details and was able to move them to a place of full acceptance and peace with the entire situation.   -Family has faxed statement to DSS -I have placed call to DSS, awaiting return call -will proceed with comfort care transition  Start Dilaudid infusion at 0.25m.hr -titrate and bolus for comfort Scheduled Ativan for seizure prophylaxis, prn for agitation Stop ALL medications not related to comfort Stop BiPap and use nasal cannula O2 only at low flow  I anticipate a rapid death once bipap is removed and dopamine discontinued. Family will be at bedside.  Patient is Catholic-priest has been already been called. Chaplain support requested.  Time: 11:45-1245PM Total time: 60 minutes  Greater than 50%  of this time was spent counseling and coordinating care related to the above assessment and plan.  ELane Hacker DO Palliative Medicine [phone]

## 2014-09-06 DEATH — deceased

## 2015-05-09 IMAGING — CT CT HEAD W/O CM
2 series · 16 of 30 positions shown, 20 images · non-contrast
Comparison: Head CT scan 10/18/2012.

CLINICAL DATA: Altered mental status.

EXAM:
CT HEAD WITHOUT CONTRAST
TECHNIQUE: Contiguous axial images were obtained from the base of the skull
through the vertex without intravenous contrast.

[Series 2: head w/o · axial · non-contrast · 0.47mm/px · z∈[-154,-9]mm · 13 of 35 slices shown, 17 images]
[im 3/35  brain]
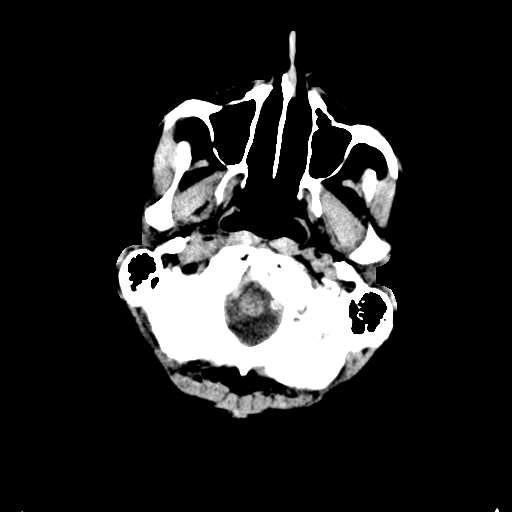
[im 3/35  bone]
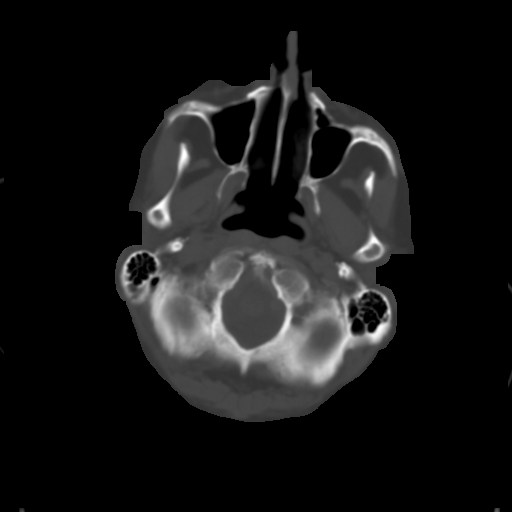
[im 5/35  brain]
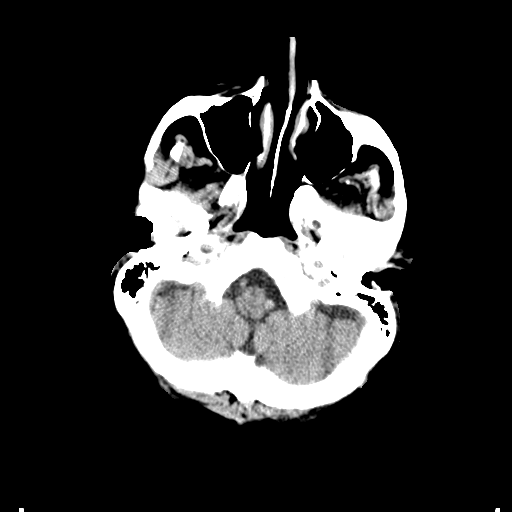
[im 8/35  brain]
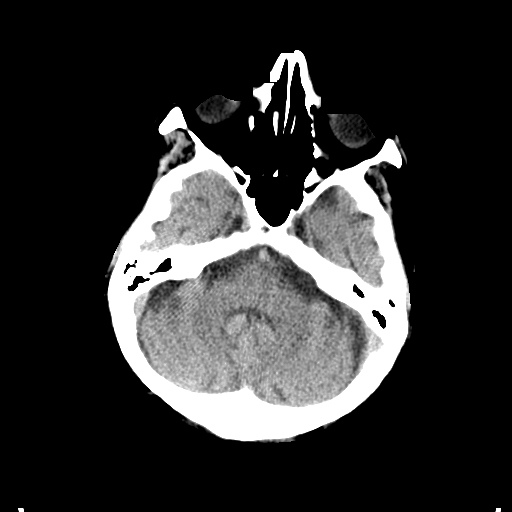
[im 10/35  brain]
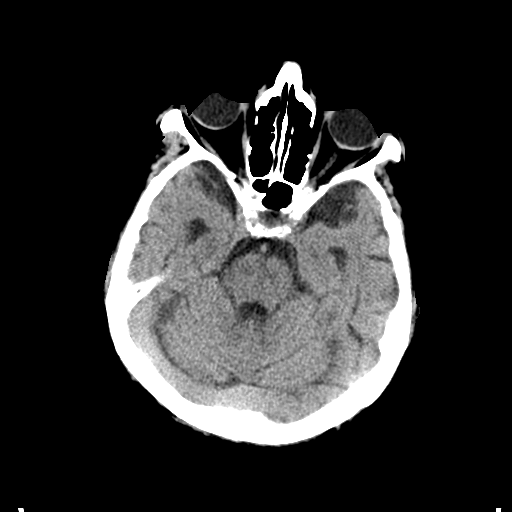
[im 13/35  brain]
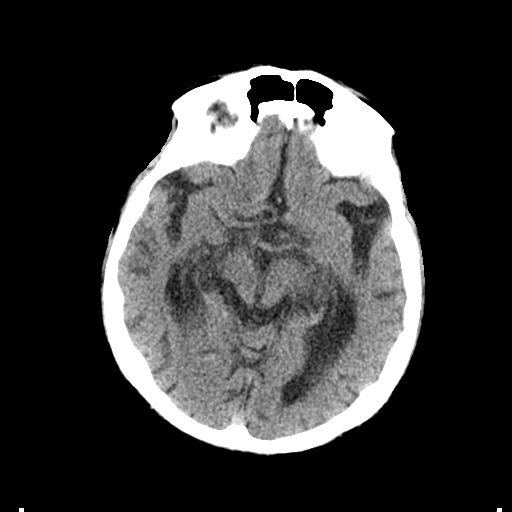
[im 13/35  bone]
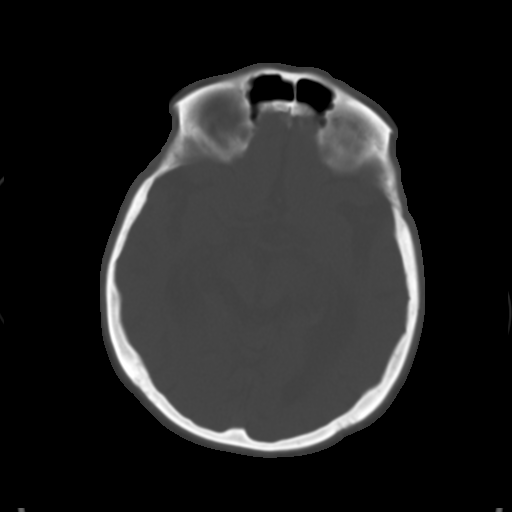
[im 15/35  brain]
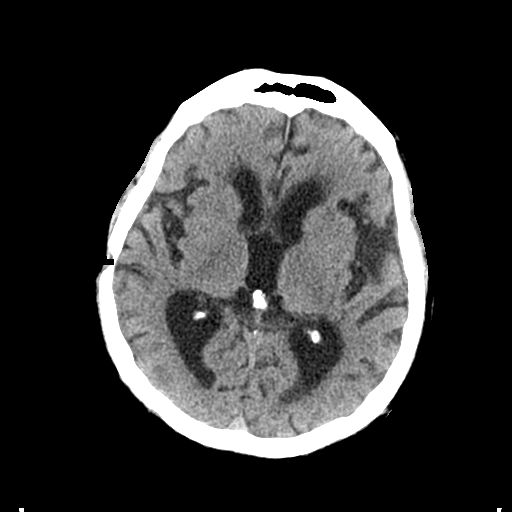
[im 18/35  brain]
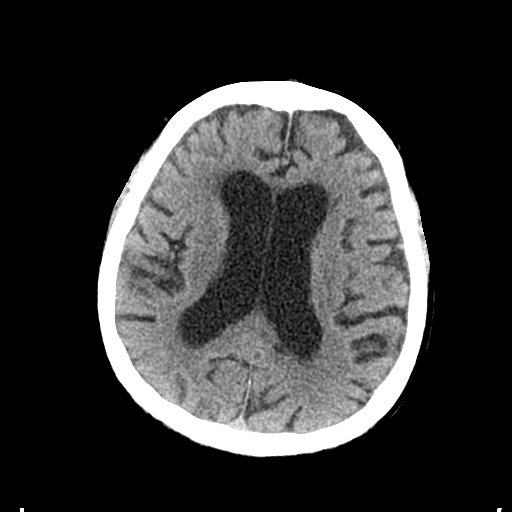
[im 20/35  brain]
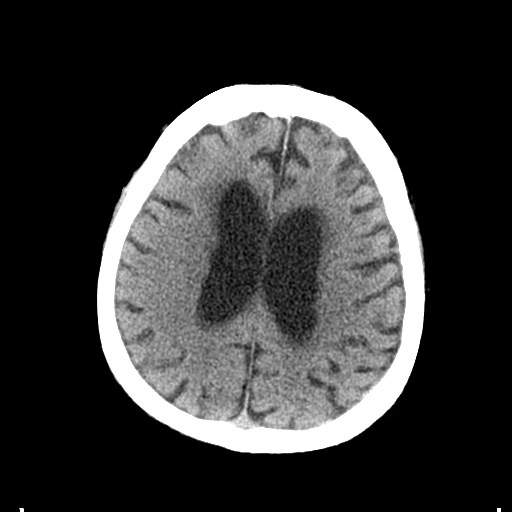
[im 22/35  brain]
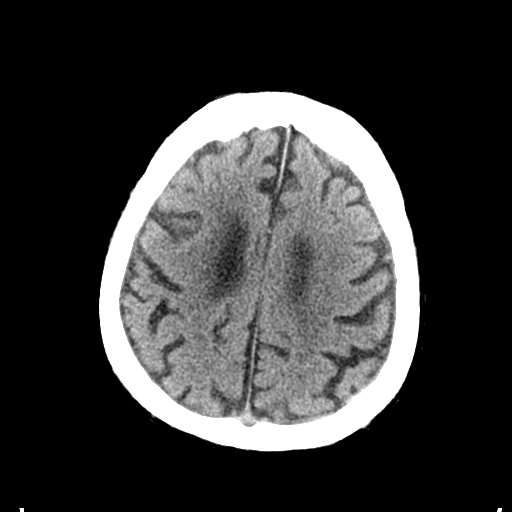
[im 22/35  bone]
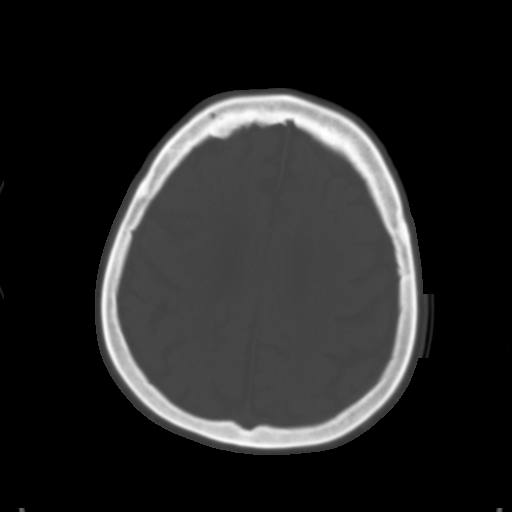
[im 25/35  brain]
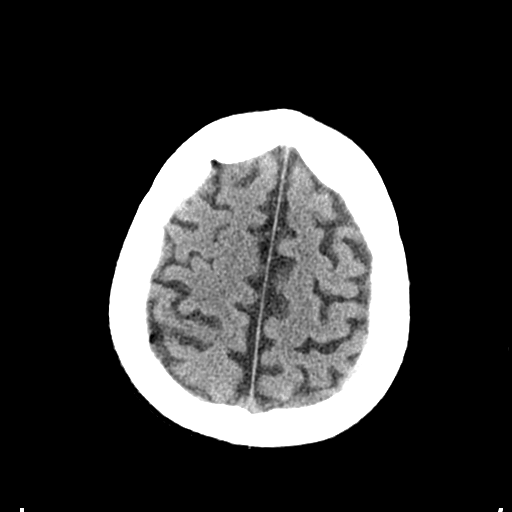
[im 27/35  brain]
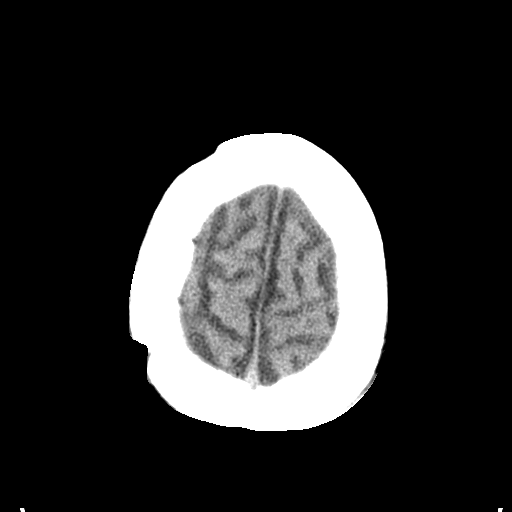
[im 30/35  brain]
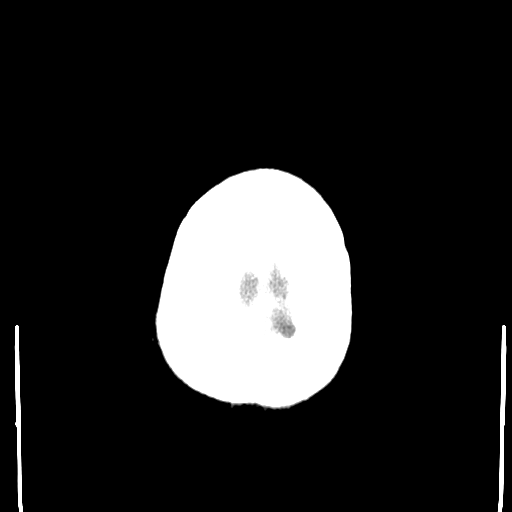
[im 32/35  brain]
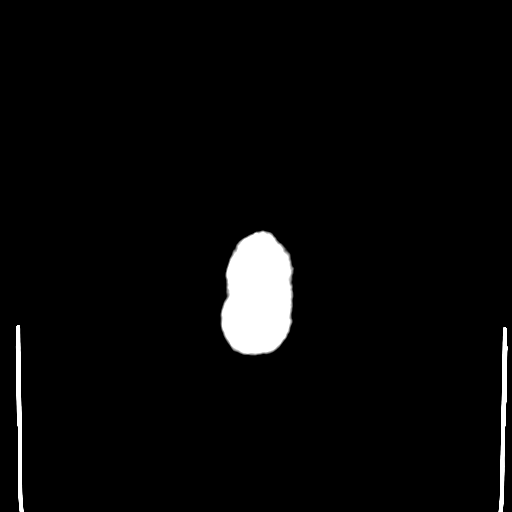
[im 32/35  bone]
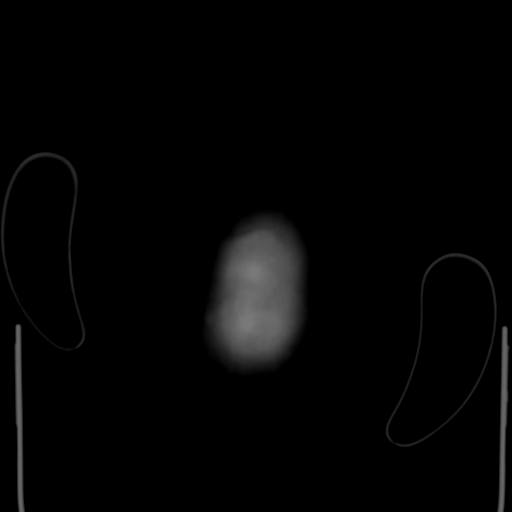

[Series 3: bone windows · axial · 0.47mm/px · z∈[-154,-104]mm · 3 of 35 slices shown]
[im 3/35  bone]
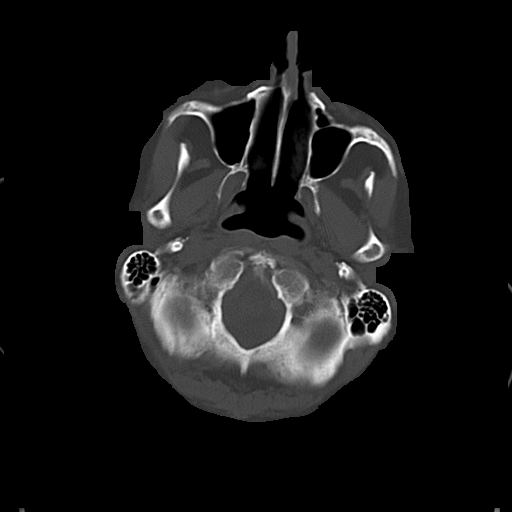
[im 8/35  bone]
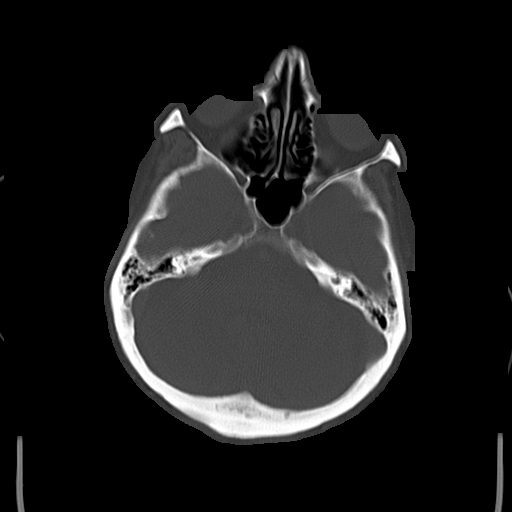
[im 13/35  bone]
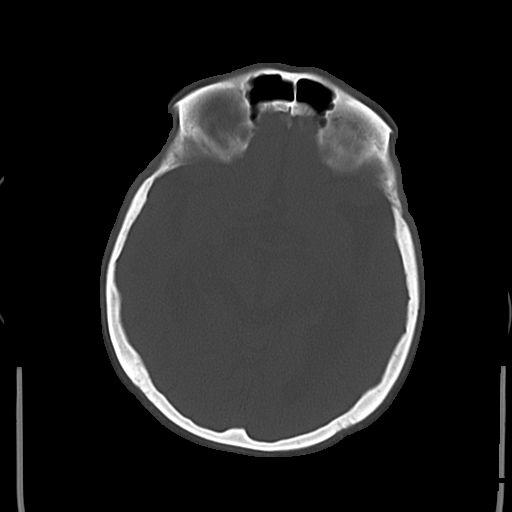

[16 of 30 positions shown; findings below may reference images not displayed]

FINDINGS: Atrophy and chronic microvascular ischemic change are stable in
appearance. No evidence of acute abnormality including hemorrhage,
infarct, mass lesion, mass effect, midline shift or abnormal
extra-axial fluid collection is identified. No hydrocephalus or
pneumocephalus. The calvarium is intact with burr holes on the right
noted.
IMPRESSION: No acute abnormality.

Atrophy and chronic microvascular ischemic change.

## 2015-05-11 IMAGING — CR DG CHEST 1V PORT
1 series · 1 of 1 positions shown · non-contrast
Comparison: Chest x-ray from yesterday

CLINICAL DATA: Hypoxia

EXAM:
PORTABLE CHEST - 1 VIEW

[AP]
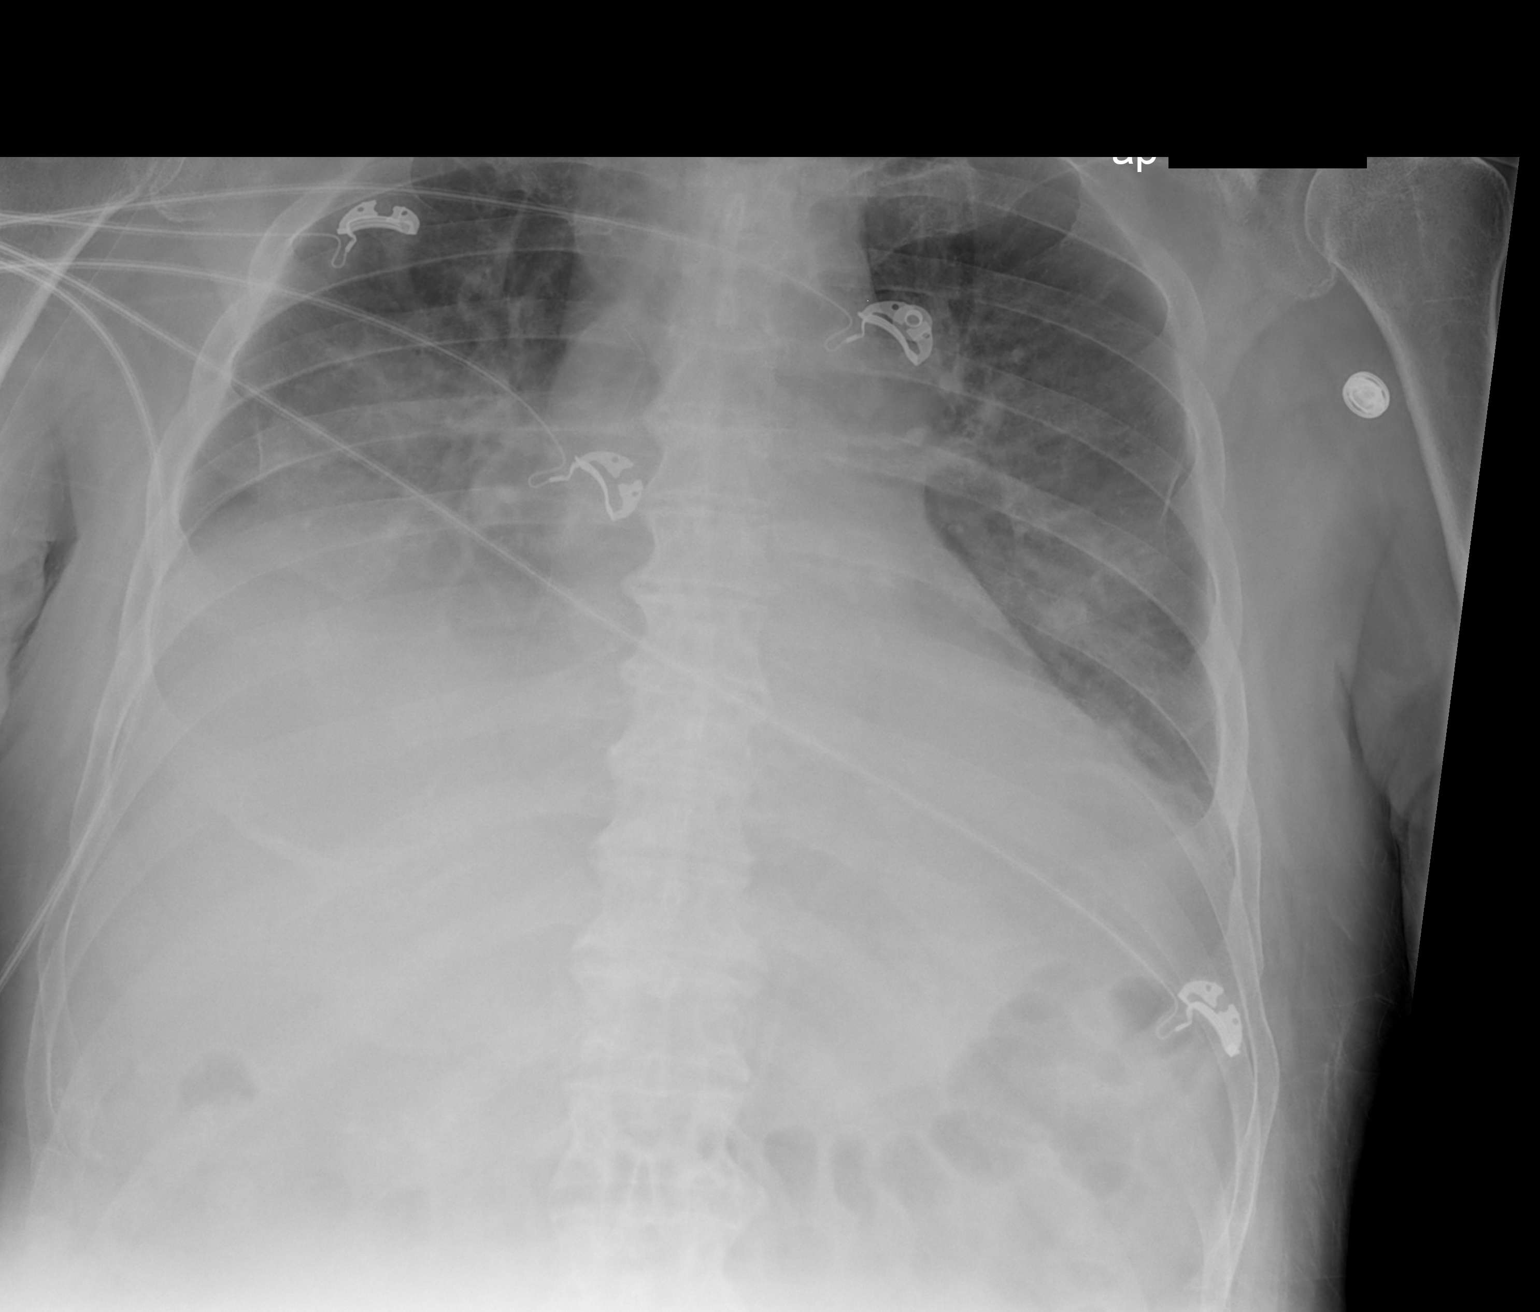

[1 of 1 positions shown; findings below may reference images not displayed]

FINDINGS: Significantly increased opacification of the chest with a hazy
appearance. Lung volumes are low. No edema in the apical lungs. No
cardiomegaly or change in aortic contours given differences in
technique.
IMPRESSION: Significant worsening of aeration, appearance suggesting atelectasis
and layering pleural effusions. Given rapid development, question
interval aspiration.

## 2015-05-12 IMAGING — DX DG CHEST 1V PORT
1 series · 1 of 1 positions shown · non-contrast
Comparison: August 17, 2014

CLINICAL DATA: Hypoxia

EXAM:
PORTABLE CHEST - 1 VIEW

[chest ap]
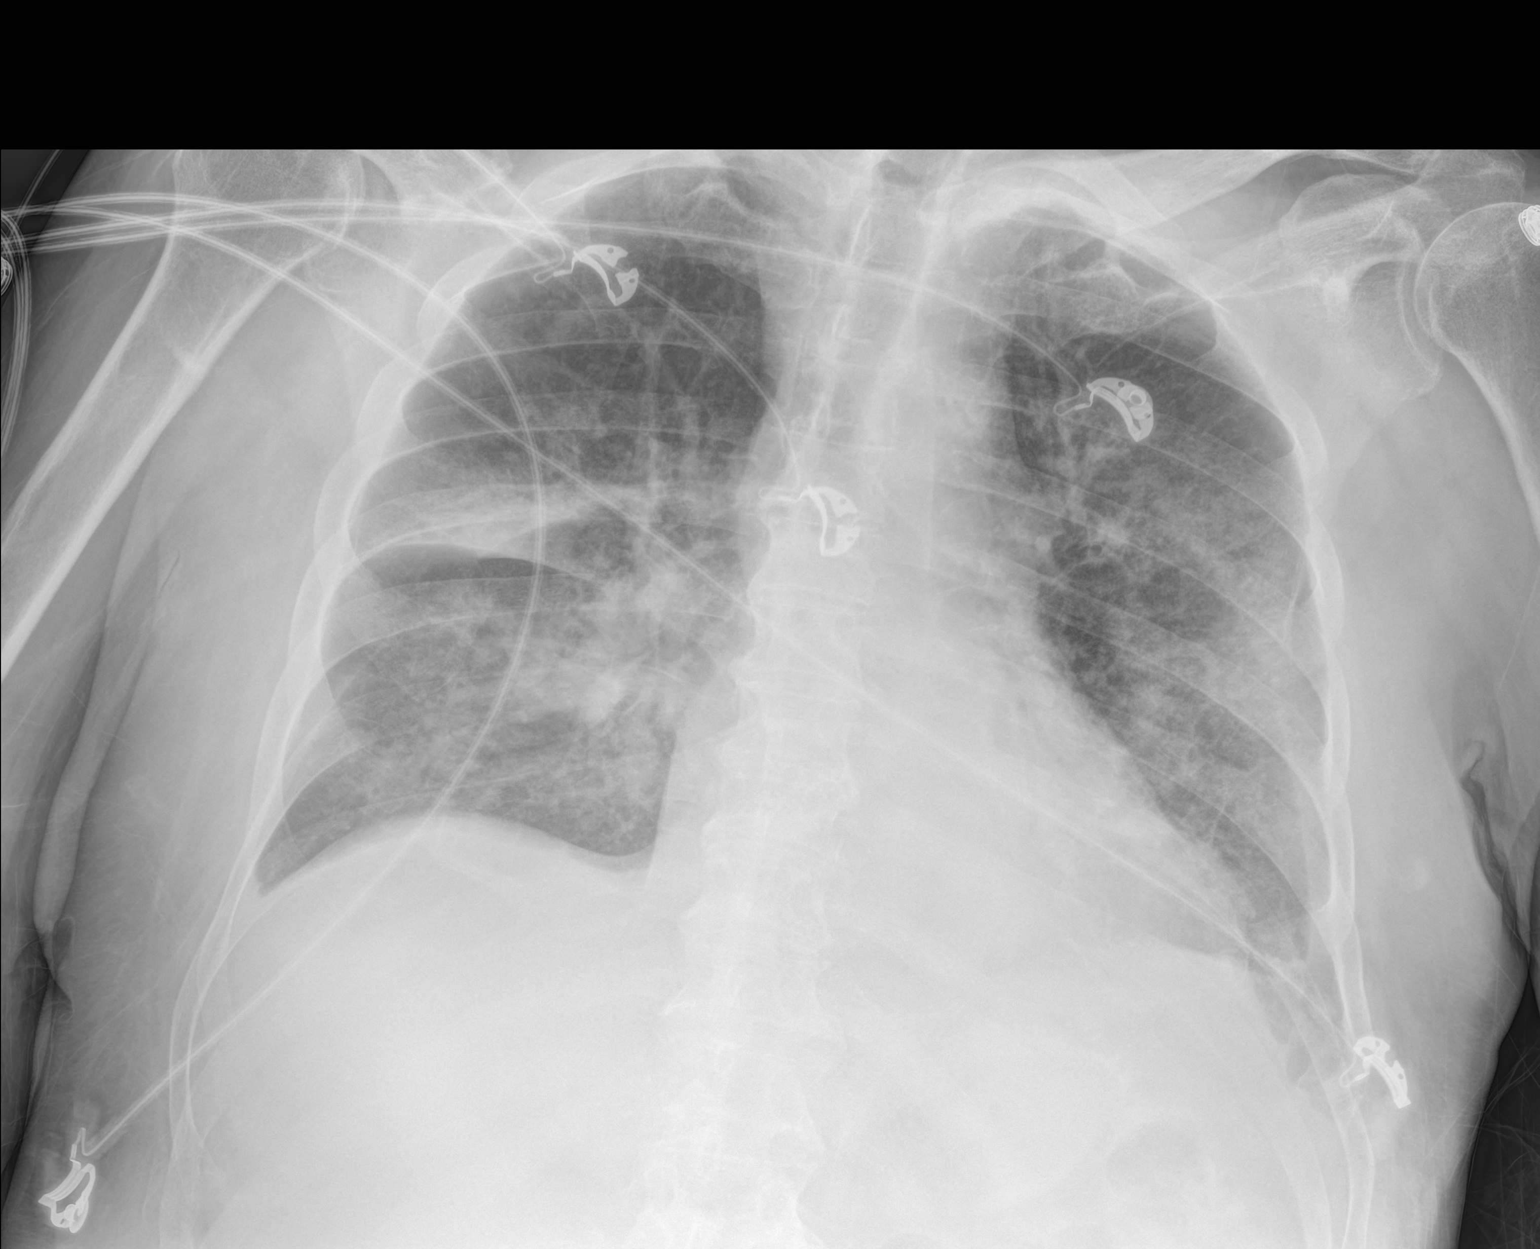

[1 of 1 positions shown; findings below may reference images not displayed]

FINDINGS: There is less consolidation in the right middle and lower lobes
compared to 1 day prior. There is moderate generalized interstitial
edema bilaterally with patchy areas of alveolar consolidation in the
left base and right mid lung regions. Heart is mildly enlarged with
pulmonary venous hypertension. No pneumothorax. No adenopathy. No
bone lesions.
IMPRESSION: Overall less consolidation on the right compared to 1 day prior.
Persistent edema with patchy areas of airspace consolidation
remaining. Heart prominent. Suspect congestive heart failure with
areas of likely patchy pneumonia superimposed. No new opacity
appreciable.
